# Patient Record
Sex: Male | Born: 1970 | Race: White | Hispanic: No | Marital: Married | Smoking: Never smoker
Health system: Southern US, Community
[De-identification: ages and names within clinical notes are randomized; demographics above are authoritative.]

## PROBLEM LIST (undated history)

## (undated) DIAGNOSIS — I10 Essential (primary) hypertension: Secondary | ICD-10-CM

## (undated) DIAGNOSIS — E7800 Pure hypercholesterolemia, unspecified: Secondary | ICD-10-CM

## (undated) DIAGNOSIS — I4891 Unspecified atrial fibrillation: Secondary | ICD-10-CM

## (undated) HISTORY — DX: Essential (primary) hypertension: I10

## (undated) HISTORY — DX: Unspecified atrial fibrillation: I48.91

---

## 2016-11-15 ENCOUNTER — Observation Stay (HOSPITAL_COMMUNITY)
Admission: EM | Admit: 2016-11-15 | Discharge: 2016-11-16 | Disposition: A | Discharge status: Home / Self Care | Payer: No Typology Code available for payment source | Attending: Family Medicine | Admitting: Family Medicine

## 2016-11-15 ENCOUNTER — Emergency Department (HOSPITAL_COMMUNITY): Payer: No Typology Code available for payment source

## 2016-11-15 ENCOUNTER — Encounter (HOSPITAL_COMMUNITY): Payer: Self-pay | Admitting: *Deleted

## 2016-11-15 DIAGNOSIS — Z79899 Other long term (current) drug therapy: Secondary | ICD-10-CM | POA: Insufficient documentation

## 2016-11-15 DIAGNOSIS — I4892 Unspecified atrial flutter: Secondary | ICD-10-CM | POA: Insufficient documentation

## 2016-11-15 DIAGNOSIS — I480 Paroxysmal atrial fibrillation: Secondary | ICD-10-CM | POA: Diagnosis present

## 2016-11-15 DIAGNOSIS — E7800 Pure hypercholesterolemia, unspecified: Secondary | ICD-10-CM | POA: Insufficient documentation

## 2016-11-15 DIAGNOSIS — Z8249 Family history of ischemic heart disease and other diseases of the circulatory system: Secondary | ICD-10-CM | POA: Diagnosis not present

## 2016-11-15 DIAGNOSIS — I4510 Unspecified right bundle-branch block: Secondary | ICD-10-CM | POA: Diagnosis not present

## 2016-11-15 DIAGNOSIS — Z7982 Long term (current) use of aspirin: Secondary | ICD-10-CM | POA: Insufficient documentation

## 2016-11-15 DIAGNOSIS — I700 Atherosclerosis of aorta: Secondary | ICD-10-CM | POA: Insufficient documentation

## 2016-11-15 DIAGNOSIS — E785 Hyperlipidemia, unspecified: Secondary | ICD-10-CM | POA: Insufficient documentation

## 2016-11-15 DIAGNOSIS — I10 Essential (primary) hypertension: Secondary | ICD-10-CM | POA: Diagnosis present

## 2016-11-15 DIAGNOSIS — I4891 Unspecified atrial fibrillation: Secondary | ICD-10-CM | POA: Diagnosis present

## 2016-11-15 DIAGNOSIS — R002 Palpitations: Secondary | ICD-10-CM | POA: Diagnosis not present

## 2016-11-15 HISTORY — DX: Essential (primary) hypertension: I10

## 2016-11-15 HISTORY — DX: Unspecified atrial fibrillation: I48.91

## 2016-11-15 HISTORY — DX: Pure hypercholesterolemia, unspecified: E78.00

## 2016-11-15 LAB — TSH: TSH: 2.961 u[IU]/mL (ref 0.350–4.500)

## 2016-11-15 LAB — CBC
HCT: 40.5 % (ref 39.0–52.0)
HEMOGLOBIN: 13.9 g/dL (ref 13.0–17.0)
MCH: 31.4 pg (ref 26.0–34.0)
MCHC: 34.3 g/dL (ref 30.0–36.0)
MCV: 91.4 fL (ref 78.0–100.0)
Platelets: 188 10*3/uL (ref 150–400)
RBC: 4.43 MIL/uL (ref 4.22–5.81)
RDW: 12.4 % (ref 11.5–15.5)
WBC: 6.6 10*3/uL (ref 4.0–10.5)

## 2016-11-15 LAB — I-STAT CHEM 8, ED
BUN: 14 mg/dL (ref 6–20)
CHLORIDE: 101 mmol/L (ref 101–111)
Calcium, Ion: 1.21 mmol/L (ref 1.15–1.40)
Creatinine, Ser: 0.8 mg/dL (ref 0.61–1.24)
Glucose, Bld: 112 mg/dL — ABNORMAL HIGH (ref 65–99)
HCT: 40 % (ref 39.0–52.0)
Hemoglobin: 13.6 g/dL (ref 13.0–17.0)
POTASSIUM: 3.5 mmol/L (ref 3.5–5.1)
SODIUM: 141 mmol/L (ref 135–145)
TCO2: 29 mmol/L (ref 0–100)

## 2016-11-15 LAB — BASIC METABOLIC PANEL
ANION GAP: 8 (ref 5–15)
BUN: 12 mg/dL (ref 6–20)
CO2: 27 mmol/L (ref 22–32)
Calcium: 9.3 mg/dL (ref 8.9–10.3)
Chloride: 104 mmol/L (ref 101–111)
Creatinine, Ser: 0.85 mg/dL (ref 0.61–1.24)
GFR calc Af Amer: >60 mL/min (ref >60)
GFR calc non Af Amer: >60 mL/min (ref >60)
Glucose, Bld: 115 mg/dL — ABNORMAL HIGH (ref 65–99)
POTASSIUM: 3.5 mmol/L (ref 3.5–5.1)
SODIUM: 139 mmol/L (ref 135–145)

## 2016-11-15 LAB — I-STAT TROPONIN, ED: Troponin i, poc: 0 ng/mL (ref 0.00–0.08)

## 2016-11-15 LAB — T4, FREE: FREE T4: 1.05 ng/dL (ref 0.61–1.12)

## 2016-11-15 MED ORDER — HEPARIN (PORCINE) IN NACL 100-0.45 UNIT/ML-% IJ SOLN
1200.0000 [IU]/h | INTRAMUSCULAR | Status: DC
  Administered 2016-11-15: 1200 [IU]/h via INTRAVENOUS
  Filled 2016-11-15: qty 250

## 2016-11-15 MED ORDER — METOPROLOL TARTRATE 25 MG PO TABS: 25.0000 mg | ORAL_TABLET | Freq: Once | ORAL | Status: DC

## 2016-11-15 MED ORDER — HEPARIN BOLUS VIA INFUSION
4000.0000 [IU] | Freq: Once | INTRAVENOUS | Status: AC
  Administered 2016-11-15: 4000 [IU] via INTRAVENOUS
  Filled 2016-11-15: qty 4000

## 2016-11-15 NOTE — ED Provider Notes (Signed)
[facility] DEPT Provider Note   CSN: [id number] Arrival date & time: 11/15/16  1948     History   Chief Complaint Chief Complaint  Patient presents with  . Abnormal ECG    HPI Johnathan Walker is a 46 y.o. male.   Palpitations   This is a new problem. Episode onset: Intermittent over the past couple of weeks. The problem occurs daily. The problem has not changed since onset.Associated with: Yesterday occurred laying in bed. Today occurred while on a run, chest discomfort, mild shortness of breath, felt like his heart was beating "weird". Patient was seen at his primary care physician, told he was in A. fib and to come to the ED. Associated symptoms include chest pain, chest pressure, irregular heartbeat and shortness of breath. Pertinent negatives include no diaphoresis, no fever, no PND, no syncope, no abdominal pain, no nausea, no vomiting, no headaches, no back pain, no lower extremity edema, no dizziness, no weakness and no cough. He has tried nothing for the symptoms. Risk factors include hyperhomocysteinemia and being male.    Past Medical History:  Diagnosis Date  . Hypercholesteremia   . Hypertension     Patient Active Problem List   Diagnosis Date Noted  . New onset atrial fibrillation ([place]) 11/15/2016    History reviewed. No pertinent surgical history.     Home Medications    Prior to Admission medications   Medication Sig Start Date End Date Taking? Authorizing Provider  aspirin-acetaminophen-caffeine (EXCEDRIN MIGRAINE) [phone] MG tablet Take 2 tablets by mouth every 6 (six) hours as needed for headache.   Yes [provider]  ibuprofen (ADVIL,MOTRIN) 200 MG tablet Take 600 mg by mouth every 6 (six) hours as needed for headache (back pain).   Yes [provider]  losartan (COZAAR) 50 MG tablet Take 75 mg by mouth daily. 10/22/16  Yes [provider]  Multiple Vitamin (MULTIVITAMIN) tablet Take 1 tablet by mouth daily.   Yes  [provider]  niacin (SLO-NIACIN) 500 MG tablet Take 1,500 mg by mouth at bedtime.   Yes [provider]  Omega 3 1200 MG CAPS Take 2,400 mg by mouth daily.   Yes [provider]    Family History No family history on file.  Social History Social History  Substance Use Topics  . Smoking status: Never Smoker  . Smokeless tobacco: Never Used  . Alcohol use No     Allergies   Patient has no known allergies.   Review of Systems Review of Systems  Constitutional: Negative for appetite change, diaphoresis and fever.  HENT: Negative for congestion.   Eyes: Negative for visual disturbance.  Respiratory: Positive for chest tightness and shortness of breath. Negative for cough.   Cardiovascular: Positive for chest pain and palpitations. Negative for syncope and PND.  Gastrointestinal: Negative for abdominal pain, blood in stool, nausea and vomiting.  Genitourinary: Negative for decreased urine volume and dysuria.  Musculoskeletal: Negative for back pain.  Skin: Negative for rash.  Neurological: Negative for dizziness, seizures, weakness, light-headedness and headaches.  Psychiatric/Behavioral: Negative for behavioral problems.     Physical Exam Updated Vital Signs BP 112/70   Pulse 80   Temp 98.1 F (36.7 C)   Resp 15   Ht 6' 2"  (1.88 m)   Wt 74.8 kg (165 lb)   SpO2 96%   BMI 21.18 kg/m   Physical Exam  Constitutional: He is oriented to person, place, and time. He appears well-developed and well-nourished.  HENT:  Head: Atraumatic.  Mouth/Throat: Oropharynx is clear and moist.  Eyes: Conjunctivae and EOM are normal.  Neck: Normal range of motion. No JVD present.  Cardiovascular: Normal heart sounds and intact distal pulses.   No murmur heard. Irregularly irregular, tachycardic  Pulmonary/Chest: Effort normal and breath sounds normal. No respiratory distress.  Abdominal: Soft. He exhibits no distension. There is no tenderness. There is  no guarding.  Musculoskeletal: Normal range of motion. He exhibits no edema.  No unilateral leg swelling, no tenderness to palpation.  Neurological: He is alert and oriented to person, place, and time.  Skin: Skin is warm.  Psychiatric: He has a normal mood and affect.     ED Treatments / Results  Labs (all labs ordered are listed, but only abnormal results are displayed) Labs Reviewed  BASIC METABOLIC PANEL - Abnormal; Notable for the following:       Result Value   Glucose, Bld 115 (*)    All other components within normal limits  I-STAT CHEM 8, ED - Abnormal; Notable for the following:    Glucose, Bld 112 (*)    All other components within normal limits  CBC  TSH  T4, FREE  HEPARIN LEVEL (UNFRACTIONATED)  CBC  I-STAT TROPOININ, ED    EKG  EKG Interpretation  Date/Time:  Monday November 15 2016 19:57:51 EDT Ventricular Rate:  135 PR Interval:    QRS Duration: 86 QT Interval:  320 QTC Calculation: 480 R Axis:   -102 Text Interpretation:  Atrial fibrillation with rapid ventricular response Right superior axis deviation Pulmonary disease pattern Right ventricular hypertrophy with repolarization abnormality Marked ST abnormality, possible inferior subendocardial injury Abnormal ECG Confirmed by Hazle Coca [phone]) on 11/15/2016 8:05:27 PM       Radiology Dg Chest Portable 1 View  Result Date: 11/15/2016 CLINICAL DATA:  New onset atrial fibrillation EXAM: PORTABLE CHEST 1 VIEW COMPARISON:  None. FINDINGS: The heart size and mediastinal contours are within normal limits. Minimal atherosclerosis of the aortic arch. Both lungs are clear. The visualized skeletal structures are unremarkable. IMPRESSION: No active disease.  Minimal aortic atherosclerosis. Electronically Signed   By: Ashley Royalty M.D.   On: 11/15/2016 20:25    Procedures Procedures (including critical care time)  Medications Ordered in ED Medications  metoprolol tartrate (LOPRESSOR) tablet 25 mg (not administered)   heparin ADULT infusion 100 units/mL (25000 units/228m sodium chloride 0.45%) (1,200 Units/hr Intravenous New Bag/Given 11/15/16 2153)  heparin bolus via infusion 4,000 Units (4,000 Units Intravenous Bolus from Bag 11/15/16 2153)     Initial Impression / Assessment and Plan / ED Course  I have reviewed the triage vital signs and the nursing notes.  Pertinent labs & imaging results that were available during my care of the patient were reviewed by me and considered in my medical decision making (see chart for details).     Patient is a 46year old male past history significant for hypertension and hyperlipidemia, who presents to the ED from his primary care physician for A. fib with RVR. New-onset A. fib. Intermittent palpitations for the past one month. No acute distress, not ill appearing. Afebrile. Tachycardic to 140s on arrival, improvement to 90-110s, hemodynamically stable.  EKG showed A. fib with RVR, rate 135, nonspecific ST changes, No signs of acute ischemia. Chest x-ray unremarkable. Initial troponin undetectable. Given that the patient has had intermittent heart palpitations for the past month, concern for paroxysmal A. fib, not a good candidate for ED cardioversion. Started on heparin infusion. Spontaneously improved rate  to 80-100 in the ED. Given by mouth Lopressor. Cardiology consulted, recommended admission to medicine.   Patient stable for the floor.  Final Clinical Impressions(s) / ED Diagnoses   Final diagnoses:  Paroxysmal atrial fibrillation [facility])    New Prescriptions New Prescriptions   No medications on file     Nathaniel Man, MD 11/15/16 2231    Quintella Reichert, MD 11/20/16 1113

## 2016-11-15 NOTE — ED Triage Notes (Signed)
Pt to ED form [facility] for new onset afib. Pt started having palpitations and sob while running this afternoon. Rate from 40-120. Pt denies pain. Hx of high cholesterol and htn that are controlled with medication

## 2016-11-15 NOTE — Progress Notes (Signed)
ANTICOAGULATION CONSULT NOTE - Initial Consult  Pharmacy Consult for Heparin Indication: atrial fibrillation  No Known Allergies  Patient Measurements: Height: 6' 2"  (188 cm) Weight: 165 lb (74.8 kg) IBW/kg (Calculated) : 82.2 Heparin Dosing Weight: 74.8 kg  Vital Signs: Temp: 98.1 F (36.7 C) (06/11 2000) BP: 140/83 (06/11 2030) Pulse Rate: 140 (06/11 2000)  Labs:  Recent Labs  11/15/16 1957 11/15/16 2015  HGB 13.9 13.6  HCT 40.5 40.0  PLT 188  --   CREATININE  --  0.80    CrCl cannot be calculated (Unknown ideal weight.).   Medical History: Past Medical History:  Diagnosis Date  . Hypercholesteremia   . Hypertension     Medications:   (Not in a hospital admission) Scheduled:  Infusions:   Assessment: 46yo male presents from OP clinic with new onset Afib. Pharmacy is consulted to dose heparin for atrial fibrillation.   Goal of Therapy:  Heparin level 0.3-0.7 units/ml Monitor platelets by anticoagulation protocol: Yes   Plan:  Give 4000 units bolus x 1 Start heparin infusion at 1200 units/hr Check anti-Xa level in 6 hours and daily while on heparin Continue to monitor H&H and platelets  Johnathan Walker. Diona Foley, PharmD, BCPS Clinical Pharmacist [phone] 11/15/2016,9:03 PM

## 2016-11-15 NOTE — ED Notes (Signed)
EDP at bedside  

## 2016-11-16 ENCOUNTER — Encounter (HOSPITAL_COMMUNITY): Payer: Self-pay | Admitting: Internal Medicine

## 2016-11-16 ENCOUNTER — Observation Stay (HOSPITAL_BASED_OUTPATIENT_CLINIC_OR_DEPARTMENT_OTHER): Payer: No Typology Code available for payment source

## 2016-11-16 DIAGNOSIS — I10 Essential (primary) hypertension: Secondary | ICD-10-CM | POA: Diagnosis not present

## 2016-11-16 DIAGNOSIS — I4891 Unspecified atrial fibrillation: Secondary | ICD-10-CM | POA: Diagnosis present

## 2016-11-16 HISTORY — DX: Unspecified atrial fibrillation: I48.91

## 2016-11-16 HISTORY — DX: Essential (primary) hypertension: I10

## 2016-11-16 LAB — BASIC METABOLIC PANEL
ANION GAP: 8 (ref 5–15)
BUN: 13 mg/dL (ref 6–20)
CO2: 25 mmol/L (ref 22–32)
CREATININE: 0.76 mg/dL (ref 0.61–1.24)
Calcium: 9.2 mg/dL (ref 8.9–10.3)
Chloride: 107 mmol/L (ref 101–111)
Glucose, Bld: 97 mg/dL (ref 65–99)
Potassium: 4.5 mmol/L (ref 3.5–5.1)
SODIUM: 140 mmol/L (ref 135–145)

## 2016-11-16 LAB — HEPARIN LEVEL (UNFRACTIONATED)
HEPARIN UNFRACTIONATED: 0.29 [IU]/mL — AB (ref 0.30–0.70)
HEPARIN UNFRACTIONATED: 0.43 [IU]/mL (ref 0.30–0.70)

## 2016-11-16 LAB — CBC
HCT: 39.4 % (ref 39.0–52.0)
HEMOGLOBIN: 13.2 g/dL (ref 13.0–17.0)
MCH: 31.1 pg (ref 26.0–34.0)
MCHC: 33.5 g/dL (ref 30.0–36.0)
MCV: 92.7 fL (ref 78.0–100.0)
PLATELETS: 187 10*3/uL (ref 150–400)
RBC: 4.25 MIL/uL (ref 4.22–5.81)
RDW: 12.6 % (ref 11.5–15.5)
WBC: 3.6 10*3/uL — AB (ref 4.0–10.5)

## 2016-11-16 LAB — ECHOCARDIOGRAM COMPLETE
HEIGHTINCHES: 74 in
Weight: 2686.4 oz

## 2016-11-16 LAB — TROPONIN I

## 2016-11-16 LAB — TSH: TSH: 3.039 u[IU]/mL (ref 0.350–4.500)

## 2016-11-16 LAB — HIV ANTIBODY (ROUTINE TESTING W REFLEX): HIV Screen 4th Generation wRfx: NONREACTIVE

## 2016-11-16 MED ORDER — ACETAMINOPHEN 650 MG RE SUPP: 650.0000 mg | Freq: Four times a day (QID) | RECTAL | Status: DC | PRN

## 2016-11-16 MED ORDER — ONDANSETRON HCL 4 MG PO TABS: 4.0000 mg | ORAL_TABLET | Freq: Four times a day (QID) | ORAL | Status: DC | PRN

## 2016-11-16 MED ORDER — HEPARIN (PORCINE) IN NACL 100-0.45 UNIT/ML-% IJ SOLN
1350.0000 [IU]/h | INTRAMUSCULAR | Status: DC
  Administered 2016-11-16: 1350 [IU]/h via INTRAVENOUS

## 2016-11-16 MED ORDER — ASPIRIN EC 81 MG PO TBEC
81.0000 mg | DELAYED_RELEASE_TABLET | Freq: Every day | ORAL | 2 refills | Status: DC
Start: 2016-11-16 — End: 2017-02-09

## 2016-11-16 MED ORDER — NIACIN ER 500 MG PO TBCR: 1500.0000 mg | EXTENDED_RELEASE_TABLET | Freq: Every day | ORAL | Status: DC

## 2016-11-16 MED ORDER — METOPROLOL TARTRATE 25 MG PO TABS: 12.5000 mg | ORAL_TABLET | Freq: Two times a day (BID) | ORAL | 0 refills | Status: DC

## 2016-11-16 MED ORDER — ONDANSETRON HCL 4 MG/2ML IJ SOLN: 4.0000 mg | Freq: Four times a day (QID) | INTRAMUSCULAR | Status: DC | PRN

## 2016-11-16 MED ORDER — LOSARTAN POTASSIUM 50 MG PO TABS
75.0000 mg | ORAL_TABLET | Freq: Every day | ORAL | Status: DC
  Administered 2016-11-16: 75 mg via ORAL
  Filled 2016-11-16: qty 1

## 2016-11-16 MED ORDER — ACETAMINOPHEN 325 MG PO TABS: 650.0000 mg | ORAL_TABLET | Freq: Four times a day (QID) | ORAL | Status: DC | PRN

## 2016-11-16 MED ORDER — OMEGA-3-ACID ETHYL ESTERS 1 G PO CAPS
2000.0000 mg | ORAL_CAPSULE | Freq: Every day | ORAL | Status: DC
  Administered 2016-11-16: 2000 mg via ORAL
  Filled 2016-11-16: qty 2

## 2016-11-16 MED ORDER — METOPROLOL TARTRATE 12.5 MG HALF TABLET
12.5000 mg | ORAL_TABLET | Freq: Two times a day (BID) | ORAL | Status: DC
  Administered 2016-11-16: 12.5 mg via ORAL
  Filled 2016-11-16: qty 1

## 2016-11-16 NOTE — Progress Notes (Signed)
  Echocardiogram 2D Echocardiogram has been performed.  Britt Petroni 11/16/2016, 12:22 PM

## 2016-11-16 NOTE — Progress Notes (Signed)
Pt arrived to floor with tech. Walked from stretcher to bed. No complaints of pain. Resting comfortably in bed with call bell within reach.

## 2016-11-16 NOTE — Progress Notes (Signed)
ANTICOAGULATION CONSULT NOTE - Follow Up Consult  Pharmacy Consult for Heparin Indication: atrial fibrillation  No Known Allergies  Patient Measurements: Height: 6' 2"  (188 cm) Weight: 167 lb 14.4 oz (76.2 kg) IBW/kg (Calculated) : 82.2  Vital Signs: Temp: 97.8 F (36.6 C) (06/12 0415) Temp Source: Oral (06/12 0415) BP: 131/64 (06/12 0415) Pulse Rate: 55 (06/12 0415)  Labs:  Recent Labs  11/15/16 1957 11/15/16 2015 11/16/16 0059 11/16/16 0729  HGB 13.9 13.6  --  13.2  HCT 40.5 40.0  --  39.4  PLT 188  --   --  187  HEPARINUNFRC  --   --  0.43 0.29*  CREATININE 0.85 0.80  --   --   TROPONINI  --   --  <0.03  --     Estimated Creatinine Clearance: 125.7 mL/min (by C-G formula based on SCr of 0.8 mg/dL).   Medications:  Heparin @ 1200 units/hr  Assessment: 46yom started on heparin yesterday for new onset afib. Initial heparin level was 0.43 but drawn only 3 hours after start of infusion. Follow up level 0.29 is just slightly below goal. CBC wnl. No bleeding.  Goal of Therapy:  Heparin level 0.3-0.7 units/ml Monitor platelets by anticoagulation protocol: Yes   Plan:  1) Increase heparin to 1350 units/hr 2) Daily heparin level and CBC 3) Follow up transition to oral AC  Deboraha Sprang 11/16/2016,8:17 AM

## 2016-11-16 NOTE — H&P (Signed)
History and Physical    Willet Schleifer LKJ:[id number] DOB: 12-03-1970 DOA: 11/15/2016  PCP: Hulan Fess, MD  Patient coming from: Home.  Chief Complaint: Palpitations.  HPI: Johnathan Walker is a 46 y.o. male with history of hypertension presents with the ER with complaints of palpitations. Patient states he has been getting palpitations off and on for last 1 month. Last evening around 5 PM and patient was going for his regular jogging patient started developing palpitations but this persisted so he went to his PCP. Along with the palpitations patient also had chest tightness and shortness of breath. Patient had gone to his PCPs office and was found to be in A. fib with RVR and was referred to the ER.   ED Course: In the ER patient's rate improved spontaneously without any intervention. He had physician and discuss with cardiologist. Patient was started on heparin and admitted for further observation. At the time of my exam patient has changed back to sinus rhythm. But has been having periods of atrial flutter and A. fib.  Review of Systems: As per HPI, rest all negative.   Past Medical History:  Diagnosis Date  . Hypercholesteremia   . Hypertension     History reviewed. No pertinent surgical history.   reports that he has never smoked. He has never used smokeless tobacco. He reports that he does not drink alcohol or use drugs.  No Known Allergies  Family History  Problem Relation Age of Onset  . Atrial fibrillation Mother     Prior to Admission medications   Medication Sig Start Date End Date Taking? Authorizing Provider  aspirin-acetaminophen-caffeine (EXCEDRIN MIGRAINE) [phone] MG tablet Take 2 tablets by mouth every 6 (six) hours as needed for headache.   Yes [provider]  ibuprofen (ADVIL,MOTRIN) 200 MG tablet Take 600 mg by mouth every 6 (six) hours as needed for headache (back pain).   Yes [provider]  losartan (COZAAR) 50 MG tablet Take 75 mg  by mouth daily. 10/22/16  Yes [provider]  Multiple Vitamin (MULTIVITAMIN) tablet Take 1 tablet by mouth daily.   Yes [provider]  niacin (SLO-NIACIN) 500 MG tablet Take 1,500 mg by mouth at bedtime.   Yes [provider]  Omega 3 1200 MG CAPS Take 2,400 mg by mouth daily.   Yes [provider]    Physical Exam: Vitals:   11/15/16 2200 11/15/16 2230 11/15/16 2257 11/15/16 2348  BP: 112/70 112/68 112/68 131/82  Pulse: 80   62  Resp: 15 14 16 16   Temp:   97.6 F (36.4 C) 97.9 F (36.6 C)  TempSrc:   Oral Oral  SpO2: 96% 96% 97% 100%  Weight:    76.2 kg (167 lb 14.4 oz)  Height:    6' 2"  (1.88 m)      Constitutional: Moderately built and nourished. Vitals:   11/15/16 2200 11/15/16 2230 11/15/16 2257 11/15/16 2348  BP: 112/70 112/68 112/68 131/82  Pulse: 80   62  Resp: 15 14 16 16   Temp:   97.6 F (36.4 C) 97.9 F (36.6 C)  TempSrc:   Oral Oral  SpO2: 96% 96% 97% 100%  Weight:    76.2 kg (167 lb 14.4 oz)  Height:    6' 2"  (1.88 m)   Eyes: Anicteric no pallor. ENMT: No discharge from the ears eyes nose and mouth. Neck: No mass felt. No neck rigidity. Respiratory: No rhonchi or crepitations. Cardiovascular: S1-S2 heard no murmurs appreciated. Abdomen: Soft  nontender bowel sounds present. Musculoskeletal: No edema. No joint effusion. Skin: No rash. Skin appears warm. Neurologic: Alert awake oriented to time place and person. Moves all extremities. Psychiatric: Appears normal. Normal affect.   Labs on Admission: I have personally reviewed following labs and imaging studies  CBC:  Recent Labs Lab 11/15/16 1957 11/15/16 2015  WBC 6.6  --   HGB 13.9 13.6  HCT 40.5 40.0  MCV 91.4  --   PLT 188  --    Basic Metabolic Panel:  Recent Labs Lab 11/15/16 1957 11/15/16 2015  NA 139 141  K 3.5 3.5  CL 104 101  CO2 27  --   GLUCOSE 115* 112*  BUN 12 14  CREATININE 0.85 0.80  CALCIUM 9.3  --    GFR: Estimated  Creatinine Clearance: 125.7 mL/min (by C-G formula based on SCr of 0.8 mg/dL). Liver Function Tests: No results for input(s): AST, ALT, ALKPHOS, BILITOT, PROT, ALBUMIN in the last 168 hours. No results for input(s): LIPASE, AMYLASE in the last 168 hours. No results for input(s): AMMONIA in the last 168 hours. Coagulation Profile: No results for input(s): INR, PROTIME in the last 168 hours. Cardiac Enzymes: No results for input(s): CKTOTAL, CKMB, CKMBINDEX, TROPONINI in the last 168 hours. BNP (last 3 results) No results for input(s): PROBNP in the last 8760 hours. HbA1C: No results for input(s): HGBA1C in the last 72 hours. CBG: No results for input(s): GLUCAP in the last 168 hours. Lipid Profile: No results for input(s): CHOL, HDL, LDLCALC, TRIG, CHOLHDL, LDLDIRECT in the last 72 hours. Thyroid Function Tests:  Recent Labs  11/15/16 2106  TSH 2.961  FREET4 1.05   Anemia Panel: No results for input(s): VITAMINB12, FOLATE, FERRITIN, TIBC, IRON, RETICCTPCT in the last 72 hours. Urine analysis: No results found for: COLORURINE, APPEARANCEUR, LABSPEC, PHURINE, GLUCOSEU, HGBUR, BILIRUBINUR, KETONESUR, PROTEINUR, UROBILINOGEN, NITRITE, LEUKOCYTESUR Sepsis Labs: @LABRCNTIP (procalcitonin:4,lacticidven:4) )No results found for this or any previous visit (from the past 240 hour(s)).   Radiological Exams on Admission: Dg Chest Portable 1 View  Result Date: 11/15/2016 CLINICAL DATA:  New onset atrial fibrillation EXAM: PORTABLE CHEST 1 VIEW COMPARISON:  None. FINDINGS: The heart size and mediastinal contours are within normal limits. Minimal atherosclerosis of the aortic arch. Both lungs are clear. The visualized skeletal structures are unremarkable. IMPRESSION: No active disease.  Minimal aortic atherosclerosis. Electronically Signed   By: Ashley Royalty M.D.   On: 11/15/2016 20:25    EKG: Independently reviewed. A. fib with RVR.  Assessment/Plan Principal Problem:   New onset atrial  fibrillation (HCC) Active Problems:   Essential hypertension   Atrial fibrillation with rapid ventricular response ([place])    1. A. fib with RVR new onset - chest 2 vasc score is 1 for hypertension. At this time patient will be placed on heparin and get 2-D echo and cycle cardiac markers check thyroid function tests and I have requested cardiology consult. I have added a small dose of Lopressor 12.5 mg by mouth twice a day. 2. Hypertension on Cozaar.   DVT prophylaxis: Heparin. Code Status: Full code.  Family Communication: Family at the bedside.  Disposition Plan: Home.  Consults called: Cardiology consult requested.  Admission status: Observation.    Rise Patience MD [facility] Pager [phone].  If 7PM-7AM, please contact night-coverage [email or web address] Password [facility]  11/16/2016, 12:24 AM

## 2016-11-16 NOTE — Progress Notes (Signed)
Discussed with the patient and all questioned fully answered. He will call me if any problems arise.  Iv removed. Telemetry removed, CCMD notified. Pt and wife verbalized understanding of all discharge instructions. Given discharge envelope.  Fritz Pickerel, RN

## 2016-11-16 NOTE — Discharge Summary (Signed)
Physician Discharge Summary  Johnathan Walker [id number] DOB: [birth year] DOA: 11/15/2016  PCP: Johnathan Fess, MD  Admit date: 11/15/2016 Discharge date: 11/16/2016  Time spent: > 35 minutes  Recommendations for Outpatient Follow-up:  1. Patient should follow-up with cardiologist for further evaluation recommendations   Discharge Diagnoses:  Principal Problem:   New onset atrial fibrillation [facility]) Active Problems:   Essential hypertension   Atrial fibrillation with rapid ventricular response [facility])   Discharge Condition: Stable  Diet recommendation: Heart healthy  Filed Weights   11/15/16 2100 11/15/16 2348  Weight: 74.8 kg (165 lb) 76.2 kg (167 lb 14.4 oz)    History of present illness:  46 year old with history of snoring who presented to his primary care physician complaining of palpitations. Was found to be new onset atrial fibrillation.  Hospital Course:  New-onset atrial fibrillation - Cardiologist consulted and recommended metoprolol and baby aspirin. - Patient should also have sleep study to see if he has OSA which is strongly suspected at this point.  For other known medical problems listed above will continue medication regimen listed below  Procedures:  Echocardiogram: EF 55-60%  Consultations:  Cardiology  Discharge Exam: Vitals:   11/16/16 0415 11/16/16 1224  BP: 131/64 127/74  Pulse: (!) 55 62  Resp: 14 17  Temp: 97.8 F (36.6 C) 99 F (37.2 C)    General: Patient in no acute distress, alert and awake Cardiovascular: Regular rate and rhythm, no murmurs or rubs Respiratory: No increased work of breathing, no wheezes  Discharge Instructions   Discharge Instructions    Call MD for:  difficulty breathing, headache or visual disturbances    Complete by:  As directed    Call MD for:  extreme fatigue    Complete by:  As directed    Call MD for:  temperature >100.4    Complete by:  As directed    Diet - low sodium heart healthy    Complete  by:  As directed    Discharge instructions    Complete by:  As directed    Please be sure to follow up with your cardiologist for further evaluation and recommendations.  Also follow up or call your primary care physician so that they can set up a sleep study and you can get evaluated for Obstructive sleep apnea   Increase activity slowly    Complete by:  As directed      Current Discharge Medication List    START taking these medications   Details  aspirin EC 81 MG tablet Take 1 tablet (81 mg total) by mouth daily. Qty: 150 tablet, Refills: 2    metoprolol tartrate (LOPRESSOR) 25 MG tablet Take 0.5 tablets (12.5 mg total) by mouth 2 (two) times daily. Qty: 30 tablet, Refills: 0      CONTINUE these medications which have NOT CHANGED   Details  losartan (COZAAR) 50 MG tablet Take 75 mg by mouth daily. Refills: 2    Multiple Vitamin (MULTIVITAMIN) tablet Take 1 tablet by mouth daily.    niacin (SLO-NIACIN) 500 MG tablet Take 1,500 mg by mouth at bedtime.    Omega 3 1200 MG CAPS Take 2,400 mg by mouth daily.      STOP taking these medications     aspirin-acetaminophen-caffeine (EXCEDRIN MIGRAINE) 250-250-65 MG tablet      ibuprofen (ADVIL,MOTRIN) 200 MG tablet        No Known Allergies    The results of significant diagnostics from this hospitalization (including imaging, microbiology, ancillary and  laboratory) are listed below for reference.    Significant Diagnostic Studies: Dg Chest Portable 1 View  Result Date: 11/15/2016 CLINICAL DATA:  New onset atrial fibrillation EXAM: PORTABLE CHEST 1 VIEW COMPARISON:  None. FINDINGS: The heart size and mediastinal contours are within normal limits. Minimal atherosclerosis of the aortic arch. Both lungs are clear. The visualized skeletal structures are unremarkable. IMPRESSION: No active disease.  Minimal aortic atherosclerosis. Electronically Signed   By: Ashley Royalty M.D.   On: 11/15/2016 20:25    Microbiology: No results  found for this or any previous visit (from the past 240 hour(s)).   Labs: Basic Metabolic Panel:  Recent Labs Lab 11/15/16 1957 11/15/16 2015 11/16/16 0729  NA 139 141 140  K 3.5 3.5 4.5  CL 104 101 107  CO2 27  --  25  GLUCOSE 115* 112* 97  BUN 12 14 13   CREATININE 0.85 0.80 0.76  CALCIUM 9.3  --  9.2   Liver Function Tests: No results for input(s): AST, ALT, ALKPHOS, BILITOT, PROT, ALBUMIN in the last 168 hours. No results for input(s): LIPASE, AMYLASE in the last 168 hours. No results for input(s): AMMONIA in the last 168 hours. CBC:  Recent Labs Lab 11/15/16 1957 11/15/16 2015 11/16/16 0729  WBC 6.6  --  3.6*  HGB 13.9 13.6 13.2  HCT 40.5 40.0 39.4  MCV 91.4  --  92.7  PLT 188  --  187   Cardiac Enzymes:  Recent Labs Lab 11/16/16 0059 11/16/16 0729  TROPONINI <0.03 <0.03   BNP: BNP (last 3 results) No results for input(s): BNP in the last 8760 hours.  ProBNP (last 3 results) No results for input(s): PROBNP in the last 8760 hours.  CBG: No results for input(s): GLUCAP in the last 168 hours.     Signed:  Velvet Bathe MD.  [facility] 11/16/2016, 2:52 PM

## 2016-11-16 NOTE — Consult Note (Signed)
Cardiology Consultation:   Patient ID: Johnathan Walker; [id number]; 29-May-1971   Admit date: 11/15/2016 Date of Consult: 11/16/2016  Primary Care Provider: Hulan Fess, MD Primary Cardiologist: New (Dr. Acie Fredrickson)  Primary Electrophysiologist:  None   Patient Profile:   Johnathan Walker is a 46 y.o. male with a hx of hypertension and hypercholesterolemia who is being seen today for the evaluation of new onset atrial fibrillation with RVR at the request of Dr. Wendee Beavers.  History of Present Illness:   [name] Presented to the ER for evaluations of palpitations that she had been feeling for over the past month. He does not have palpitations on a daily basis. Yesterday around 5 pm she went jogging and developed persistent palpitations associated with SOB and tightness in his chest, went to his PCP and was noted to be in atrial fibrillation with RVR ( hr 130-140s ) and sent to the ER.  ER Course: In the ER he spontaneously had improvement of his rate and ultimately his rhythm. He was started on heparin and admitted for observation.   Hospital course:  Lopressor 12.5 mg BID added by [facility], rate controlled this AM and in sinus. Normal TSH, neg cardiac enzymes x 2, potassium 4.5 Echocardiogram pending. He has not had any reoccurrence of palpitations since midnight last night. This morning he feels well, without CP, SOB, or le swelling.  Past Medical History:  Diagnosis Date  . Hypercholesteremia   . Hypertension     History reviewed. No pertinent surgical history.   Inpatient Medications: Scheduled Meds: . losartan  75 mg Oral Daily  . metoprolol tartrate  12.5 mg Oral BID  . niacin  1,500 mg Oral QHS  . omega-3 acid ethyl esters  2,000 mg Oral Daily   Continuous Infusions: . heparin 1,350 Units/hr (11/16/16 0900)   PRN Meds: acetaminophen **OR** acetaminophen, ondansetron **OR** ondansetron (ZOFRAN) IV  Allergies:   No Known Allergies  Social History:   Social History    Social History  . Marital status: Married    Spouse name: N/A  . Number of children: N/A  . Years of education: N/A   Occupational History  . Not on file.   Social History Main Topics  . Smoking status: Never Smoker  . Smokeless tobacco: Never Used  . Alcohol use No  . Drug use: No  . Sexual activity: No   Other Topics Concern  . Not on file   Social History Narrative  . No narrative on file    Family History:   The patient's family history includes Atrial fibrillation in his mother.  ROS:  Please see the history of present illness.  All other ROS reviewed and negative.     Physical Exam/Data:   Vitals:   11/15/16 2230 11/15/16 2257 11/15/16 2348 11/16/16 0415  BP: 112/68 112/68 131/82 131/64  Pulse:   62 (!) 55  Resp: 14 16 16 14   Temp:  97.6 F (36.4 C) 97.9 F (36.6 C) 97.8 F (36.6 C)  TempSrc:  Oral Oral Oral  SpO2: 96% 97% 100% 99%  Weight:   167 lb 14.4 oz (76.2 kg)   Height:   6' 2"  (1.88 m)    No intake or output data in the 24 hours ending 11/16/16 1102 Filed Weights   11/15/16 2100 11/15/16 2348  Weight: 165 lb (74.8 kg) 167 lb 14.4 oz (76.2 kg)   Body mass index is 21.56 kg/m.  General: Well developed, well nourished, in no acute distress. Head: Normocephalic,  atraumatic, sclera non-icteric, no xanthomas, nares are without discharge.  Neck: Negative for carotid bruits. JVD not elevated. Lungs: Clear bilaterally to auscultation without wheezes, rales, or rhonchi. Breathing is unlabored. Heart: RRR with S1 S2. No murmurs, rubs, or gallops appreciated. Abdomen: Soft, non-tender, non-distended with normoactive bowel sounds. No hepatomegaly. No rebound/guarding. No obvious abdominal masses. Msk:  Strength and tone appear normal for age. Extremities: No clubbing or cyanosis. No edema.  Distal pedal pulses are 2+ and equal bilaterally. Neuro: Alert and oriented X 3. No facial asymmetry. No focal deficit. Moves all extremities spontaneously. Psych:   Responds to questions appropriately with a normal affect.  EKG:  The EKG was personally reviewed and demonstrates HR 60s, SR, RBBB. No old EKG for comparison.  Relevant CV Studies:  Echo pending this admission  Laboratory Data:  Chemistry Recent Labs Lab 11/15/16 1957 11/15/16 2015 11/16/16 0729  NA 139 141 140  K 3.5 3.5 4.5  CL 104 101 107  CO2 27  --  25  GLUCOSE 115* 112* 97  BUN 12 14 13   CREATININE 0.85 0.80 0.76  CALCIUM 9.3  --  9.2  GFRNONAA >60  --  >60  GFRAA >60  --  >60  ANIONGAP 8  --  8    No results for input(s): PROT, ALBUMIN, AST, ALT, ALKPHOS, BILITOT in the last 168 hours. Hematology Recent Labs Lab 11/15/16 1957 11/15/16 2015 11/16/16 0729  WBC 6.6  --  3.6*  RBC 4.43  --  4.25  HGB 13.9 13.6 13.2  HCT 40.5 40.0 39.4  MCV 91.4  --  92.7  MCH 31.4  --  31.1  MCHC 34.3  --  33.5  RDW 12.4  --  12.6  PLT 188  --  187   Cardiac Enzymes Recent Labs Lab 11/16/16 0059 11/16/16 0729  TROPONINI <0.03 <0.03    Recent Labs Lab 11/15/16 2009  TROPIPOC 0.00    BNPNo results for input(s): BNP, PROBNP in the last 168 hours.  DDimer No results for input(s): DDIMER in the last 168 hours.  Radiology/Studies:  Dg Chest Portable 1 View  Result Date: 11/15/2016 CLINICAL DATA:  New onset atrial fibrillation EXAM: PORTABLE CHEST 1 VIEW COMPARISON:  None. FINDINGS: The heart size and mediastinal contours are within normal limits. Minimal atherosclerosis of the aortic arch. Both lungs are clear. The visualized skeletal structures are unremarkable. IMPRESSION: No active disease.  Minimal aortic atherosclerosis. Electronically Signed   By: Ashley Royalty M.D.   On: 11/15/2016 20:25    Assessment and Plan:   1. Atrial Fibrillation with RVR: Patient has been having palpitation sx the past month, likely has been going in and out of afib. Denies having hx of this diagnosis. CHADVASC score is 1 for hypertension, Currently on heparin we will need to transition  to oral anticoagulants. -- Lopressor 12.5 mg BID given, HR between 50-60 this am, not sure that this can be titrated any further at this time. -- no reoccurance of fib overnight.  -- echocardiogram is pending  2. Hypertension: well controlled. Continue home dose of Cozaar.  Kristopher Glee, PA-C  11/16/2016 11:02 AM   Attending Note:   The patient was seen and examined.  Agree with assessment and plan as noted above.  Changes made to the above note as needed.  Patient seen and independently examined with Delos Haring, PA .   We discussed all aspects of the encounter. I agree with the assessment and plan as stated  above.  1. Paroxysmal atrial fib:   Has converted to NSR On low dose metoprolol currently  Getting his echo  Wife says that he snores loudly and has apneic episodes .  He should be scheduled for sleep study  CHADS2VASC is 1.  I would not start [place] at this point.      2. HTN:  Continue meds.    He may be able to be discharged later today  I will see him in the office for follow up    I have spent a total of 40 minutes with patient reviewing hospital  notes , telemetry, EKGs, labs and examining patient as well as establishing an assessment and plan that was discussed with the patient. > 50% of time was spent in direct patient care.    Thayer Headings, Brooke Bonito., MD, [facility] 11/16/2016, 11:58 AM [address],  [place] Pager [phone]

## 2016-11-16 NOTE — Progress Notes (Signed)
Patient seen and evaluated earlier the same by my associate. His main complaint was palpitations and patient upon further evaluation was found to have A. fib with RVR.  I have consulted cardiology for further evaluation recommendations. Echocardiogram pending.  Gen.: Patient in no acute distress, alert and awake Cardiovascular: No cyanosis Pulmonary: No increased work of breathing, equal chest rise  We'll plan on reassessing patient next a.m. earlier should any new medical conditions arise.  Johnathan Walker, [employer]

## 2016-11-19 ENCOUNTER — Telehealth: Payer: Self-pay

## 2016-11-19 NOTE — Telephone Encounter (Signed)
SENT NOTES TO SCHEDULING 

## 2016-12-07 ENCOUNTER — Ambulatory Visit (INDEPENDENT_AMBULATORY_CARE_PROVIDER_SITE_OTHER): Payer: No Typology Code available for payment source | Admitting: Cardiology

## 2016-12-07 ENCOUNTER — Encounter: Payer: Self-pay | Admitting: Cardiology

## 2016-12-07 VITALS — BP 104/72 | HR 48 | Ht 74.0 in | Wt 178.0 lb

## 2016-12-07 DIAGNOSIS — I10 Essential (primary) hypertension: Secondary | ICD-10-CM

## 2016-12-07 DIAGNOSIS — I480 Paroxysmal atrial fibrillation: Secondary | ICD-10-CM

## 2016-12-07 MED ORDER — METOPROLOL TARTRATE 25 MG PO TABS: 12.5000 mg | ORAL_TABLET | Freq: Two times a day (BID) | ORAL | 2 refills | Status: DC

## 2016-12-07 NOTE — Progress Notes (Signed)
Electrophysiology Office Note   Date:  12/07/2016   ID:  Johnathan Walker, DOB September 01, 1970, MRN [id number]  PCP:  Hulan Fess, MD  Cardiologist: [place] Primary Electrophysiologist:  Georganna Maxson Meredith Leeds, MD    Chief Complaint  Patient presents with  . Advice Only    PAF     History of Present Illness: Johnathan Walker is a 46 y.o. male who is being seen today for the evaluation of atrial fibrillation at the request of Hulan Fess, MD. Presenting today for electrophysiology evaluation. He was hospitalized June 11th 2018 with new onset atrial fibrillation and rapid ventricular response. He was going for a run, and felt palpitations prior to that. He only ran a mile and had shortness of breath and fatigue. Echo at the time showed an EF of 55-60%. While in the emergency room, he spontaneously went into sinus rhythm. He was placed on metoprolol 12.5 mg. His wife reported in her mid and apneic episodes and he was referred for a sleep study. He has had intermittent palpitations for the past several years occurring for 5-30 minutes at a time. These are not associated with shortness of breath or lightheadedness. The episode on June 11 lasted longer and was associated with shortness of breath. Since being in the hospital, he has had a few episodes of palpitations. He says that it feels like a humming burden in his chest. These have lasted a few minutes. He feels that his symptoms have improved since being on the metoprolol.    Today, he denies symptoms of palpitations, chest pain, shortness of breath, orthopnea, PND, lower extremity edema, claudication, dizziness, presyncope, syncope, bleeding, or neurologic sequela. The patient is tolerating medications without difficulties.    Past Medical History:  Diagnosis Date  . Atrial fibrillation with rapid ventricular response ([place]) 11/16/2016  . Essential hypertension 11/16/2016  . Hypercholesteremia   . Hypertension   . New onset atrial fibrillation  ([place]) 11/15/2016   History reviewed. No pertinent surgical history.   Current Outpatient Prescriptions  Medication Sig Dispense Refill  . aspirin EC 81 MG tablet Take 1 tablet (81 mg total) by mouth daily. 150 tablet 2  . losartan (COZAAR) 50 MG tablet Take 75 mg by mouth daily.  2  . metoprolol tartrate (LOPRESSOR) 25 MG tablet Take 0.5 tablets (12.5 mg total) by mouth 2 (two) times daily. 30 tablet 0  . Multiple Vitamin (MULTIVITAMIN) tablet Take 1 tablet by mouth daily.    . niacin (SLO-NIACIN) 500 MG tablet Take 1,500 mg by mouth at bedtime.    . Omega 3 1200 MG CAPS Take 2,400 mg by mouth daily.     No current facility-administered medications for this visit.     Allergies:   Patient has no known allergies.   Social History:  The patient  reports that he has never smoked. He has never used smokeless tobacco. He reports that he does not drink alcohol or use drugs.   Family History:  The patient's family history includes Atrial fibrillation in his mother; Diabetes in his father; Hypertension in his father; Lung cancer in his father.    ROS:  Please see the history of present illness.   Otherwise, review of systems is positive for none.   All other systems are reviewed and negative.    PHYSICAL EXAM: VS:  BP 104/72   Pulse (!) 48   Ht 6' 2"  (1.88 m)   Wt 178 lb (80.7 kg)   BMI 22.85 kg/m  , BMI Body  mass index is 22.85 kg/m. GEN: Well nourished, well developed, in no acute distress  HEENT: normal  Neck: no JVD, carotid bruits, or masses Cardiac: RRR; no murmurs, rubs, or gallops,no edema  Respiratory:  clear to auscultation bilaterally, normal work of breathing GI: soft, nontender, nondistended, + BS MS: no deformity or atrophy  Skin: warm and dry Neuro:  Strength and sensation are intact Psych: euthymic mood, full affect  EKG:  EKG is ordered today. Personal review of the ekg ordered shows Sinus rhythm, incomplete right bundle-branch block, QRS duration 102 ms, rate  48  Recent Labs: 11/16/2016: BUN 13; Creatinine, Ser 0.76; Hemoglobin 13.2; Platelets 187; Potassium 4.5; Sodium 140; TSH 3.039    Lipid Panel  No results found for: CHOL, TRIG, HDL, CHOLHDL, VLDL, LDLCALC, LDLDIRECT   Wt Readings from Last 3 Encounters:  12/07/16 178 lb (80.7 kg)  11/15/16 167 lb 14.4 oz (76.2 kg)      Other studies Reviewed: Additional studies/ records that were reviewed today include: TTE 11/16/16, outside records - personally reviewed  Review of the above records today demonstrates:  - Left ventricle: The cavity size was normal. Systolic function was   normal. The estimated ejection fraction was in the range of 55%   to 60%. Wall motion was normal; there were no regional wall   motion abnormalities. Left ventricular diastolic function   parameters were normal. - Atrial septum: No defect or patent foramen ovale was identified.   ASSESSMENT AND PLAN:  1.  Paroxysmal atrial fibrillation: Currently at a low risk for stroke. Is tolerating metoprolol well and having less episodes of atrial fibrillation. I did discuss with him further medical management with antiarrhythmic, including flecainide. At this time since he is tolerating his medications well, Shyan Scalisi plan to keep his metoprolol and not start any further antiarrhythmics.  This patients CHA2DS2-VASc Score and unadjusted Ischemic Stroke Rate (% per year) is equal to 0.6 % stroke rate/year from a score of 1  Above score calculated as 1 point each if present [CHF, HTN, DM, Vascular=MI/PAD/Aortic Plaque, Age if 65-74, or Male] Above score calculated as 2 points each if present [Age > 75, or Stroke/TIA/TE]   2. Hypertension: Well-controlled today. No medication changes at this time.    Current medicines are reviewed at length with the patient today.   The patient does not have concerns regarding his medicines.  The following changes were made today:  none  Labs/ tests ordered today include:  Orders Placed  This Encounter  Procedures  . EKG 12-Lead     Disposition:   FU with Michille Mcelrath 3 months  Signed, Kailea Dannemiller Meredith Leeds, MD  12/07/2016 2:31 PM     [facility] [address] [place] [address] [phone] (office) [phone] (fax)

## 2016-12-07 NOTE — Patient Instructions (Signed)
Medication Instructions:    Your physician recommends that you continue on your current medications as directed. Please refer to the Current Medication list given to you today.  - If you need a refill on your cardiac medications before your next appointment, please call your pharmacy.   Labwork:  None ordered  Testing/Procedures:  None ordered  Follow-Up:  Your physician recommends that you schedule a follow-up appointment in: 3 months with Dr. Curt Bears.  Thank you for choosing [facility]!!   Trinidad Curet, RN [phone]

## 2017-01-24 ENCOUNTER — Telehealth: Payer: Self-pay | Admitting: Cardiology

## 2017-01-24 NOTE — Telephone Encounter (Signed)
Patient c/o the frequency of a-fib episodes increasing. Patient said he has symptoms 3-4 times per week lasting 4-5 hours. Patient said that a couple of times, it has prevented him from running in the afternoon. Patient said that he feels his heart racing, has difficulty taking a deep breath and easily fatigued when he is in a-fib. BP ranging around 110/60-70's &  HR in the 60's. No active chest pain, dizziness or chest pain. Patient said that he did not have symptoms of being in a-fib while on the phone with nurse. Patient is requesting a sooner appointment and is willing to be seen in the a-fib clinic on a Thursday or Friday anytime of the day.  [facility] for an appointment. Awaiting call back.

## 2017-01-24 NOTE — Telephone Encounter (Signed)
New message      Talk to a nurse about his AFIB.  Pt has an appt on 02-09-17 but not sure if he should wait that long.  He want to talk to a nurse about this.

## 2017-01-24 NOTE — Telephone Encounter (Signed)
Appointment scheduled for 01/27/17 @11 :00 am with Roderic Palau NP. Patient informed and verbalized understanding.

## 2017-01-27 ENCOUNTER — Encounter (HOSPITAL_COMMUNITY): Payer: Self-pay | Admitting: Nurse Practitioner

## 2017-01-27 ENCOUNTER — Ambulatory Visit (HOSPITAL_COMMUNITY)
Admission: RE | Admit: 2017-01-27 | Discharge: 2017-01-27 | Disposition: A | Discharge status: Home / Self Care | Payer: No Typology Code available for payment source | Source: Ambulatory Visit | Attending: Nurse Practitioner | Admitting: Nurse Practitioner

## 2017-01-27 VITALS — BP 126/74 | HR 52 | Ht 74.0 in | Wt 176.6 lb

## 2017-01-27 DIAGNOSIS — Z79899 Other long term (current) drug therapy: Secondary | ICD-10-CM | POA: Insufficient documentation

## 2017-01-27 DIAGNOSIS — I480 Paroxysmal atrial fibrillation: Secondary | ICD-10-CM

## 2017-01-27 DIAGNOSIS — I10 Essential (primary) hypertension: Secondary | ICD-10-CM | POA: Diagnosis not present

## 2017-01-27 DIAGNOSIS — Z7982 Long term (current) use of aspirin: Secondary | ICD-10-CM | POA: Insufficient documentation

## 2017-01-27 DIAGNOSIS — E7800 Pure hypercholesterolemia, unspecified: Secondary | ICD-10-CM | POA: Diagnosis not present

## 2017-01-27 DIAGNOSIS — I4891 Unspecified atrial fibrillation: Secondary | ICD-10-CM | POA: Diagnosis present

## 2017-01-27 MED ORDER — FLECAINIDE ACETATE 50 MG PO TABS: 50.0000 mg | ORAL_TABLET | Freq: Two times a day (BID) | ORAL | 1 refills | Status: DC

## 2017-01-27 NOTE — Progress Notes (Signed)
Primary Care Physician: Hulan Fess, MD Referring Physician: [facility] triage EP: Dr. Irven Shelling is a 46 y.o. male with a h/o paroxysmal afib in June of this year where he was treated with first onset afib. He was started on metoprolol. It was discussed that he snored with apnea episodes and sleep study was discussed. He was seen f/u with Dr. Curt Bears and his afib burden was low so he was to continue on metoprolol and baby asa with chadsvasc score of 1. If burden did increase flecainide was discussed.  He is being seen in the afib clinic today for increased afib burden in that it occurring  more so with his running. He runs 3-5 miles 3x a week. He is having 3-4 episodes a week,late afternoon that will last 1-5 hours. He has not tried any extra metoprolol for breakthrough afib.He is avoiding excessive caffeine, no alcohol, sleep study is still pending.  Today, he denies symptoms of palpitations, chest pain, shortness of breath, orthopnea, PND, lower extremity edema, dizziness, presyncope, syncope, or neurologic sequela. The patient is tolerating medications without difficulties and is otherwise without complaint today.   Past Medical History:  Diagnosis Date  . Atrial fibrillation with rapid ventricular response ([place]) 11/16/2016  . Essential hypertension 11/16/2016  . Hypercholesteremia   . Hypertension   . New onset atrial fibrillation ([place]) 11/15/2016   No past surgical history on file.  Current Outpatient Prescriptions  Medication Sig Dispense Refill  . aspirin EC 81 MG tablet Take 1 tablet (81 mg total) by mouth daily. 150 tablet 2  . losartan (COZAAR) 50 MG tablet Take 75 mg by mouth daily.  2  . metoprolol tartrate (LOPRESSOR) 25 MG tablet Take 0.5 tablets (12.5 mg total) by mouth 2 (two) times daily. 180 tablet 2  . Misc Natural Products (TART CHERRY ADVANCED PO) Take 2 capsules by mouth daily.    . Multiple Vitamin (MULTIVITAMIN) tablet Take 1 tablet by mouth  daily.    . niacin (SLO-NIACIN) 500 MG tablet Take 1,500 mg by mouth at bedtime.    . Omega 3 1200 MG CAPS Take 2,400 mg by mouth daily.    . flecainide (TAMBOCOR) 50 MG tablet Take 1 tablet (50 mg total) by mouth 2 (two) times daily. 60 tablet 1   No current facility-administered medications for this encounter.     No Known Allergies  Social History   Social History  . Marital status: Married    Spouse name: N/A  . Number of children: N/A  . Years of education: N/A   Occupational History  . Not on file.   Social History Main Topics  . Smoking status: Never Smoker  . Smokeless tobacco: Never Used  . Alcohol use No  . Drug use: No  . Sexual activity: No   Other Topics Concern  . Not on file   Social History Narrative  . No narrative on file    Family History  Problem Relation Age of Onset  . Atrial fibrillation Mother   . Diabetes Father   . Hypertension Father   . Lung cancer Father     ROS- All systems are reviewed and negative except as per the HPI above  Physical Exam: Vitals:   01/27/17 1059  BP: 126/74  Pulse: (!) 52  Weight: 176 lb 9.6 oz (80.1 kg)  Height: 6' 2"  (1.88 m)   Wt Readings from Last 3 Encounters:  01/27/17 176 lb 9.6 oz (80.1 kg)  12/07/16 178 lb (80.7 kg)  11/15/16 167 lb 14.4 oz (76.2 kg)    Labs: Lab Results  Component Value Date   NA 140 11/16/2016   K 4.5 11/16/2016   CL 107 11/16/2016   CO2 25 11/16/2016   GLUCOSE 97 11/16/2016   BUN 13 11/16/2016   CREATININE 0.76 11/16/2016   CALCIUM 9.2 11/16/2016   No results found for: INR No results found for: CHOL, HDL, LDLCALC, TRIG   GEN- The patient is well appearing, alert and oriented x 3 today.   Head- normocephalic, atraumatic Eyes-  Sclera clear, conjunctiva pink Ears- hearing intact Oropharynx- clear Neck- supple, no JVP Lymph- no cervical lymphadenopathy Lungs- Clear to ausculation bilaterally, normal work of breathing Heart- Regular rate and rhythm, no  murmurs, rubs or gallops, PMI not laterally displaced GI- soft, NT, ND, + BS Extremities- no clubbing, cyanosis, or edema MS- no significant deformity or atrophy Skin- no rash or lesion Psych- euthymic mood, full affect Neuro- strength and sensation are intact  EKG-Sinus brady at 52 bpm, Pr int 158 ms, qrs int 98 ms, qtc 403 ms Echo-- Left ventricle: The cavity size was normal. Systolic function was normal. The estimated ejection fraction was in the range of 55% to 60%. Wall motion was normal; there were no regional wall motion abnormalities. Left ventricular diastolic function parameters were normal. - Atrial septum: No defect or patent foramen ovale was identified.   Assessment and Plan: 1. Paroxysmal afib Pt feels burden is increasing Discussed with Dr. Curt Bears and will start flecainide 50 mg bid Continue metoprolol 25 mg 1/2 tab bid  2. Chadsvasc score of 1(htn) Continue baby asa  He will f/u Monday for EKG on flecainide  Pt has been asked not to run until f/u ETT on flecainide, which will be scheduled soon, if EKG looks good on Monday with Flecainide on board  Macgregor Aeschliman C. Salim Forero, [facility] [address] [place], [place] [address] [phone]

## 2017-01-31 ENCOUNTER — Ambulatory Visit (HOSPITAL_COMMUNITY)
Admission: RE | Admit: 2017-01-31 | Discharge: 2017-01-31 | Disposition: A | Discharge status: Home / Self Care | Payer: No Typology Code available for payment source | Source: Ambulatory Visit | Attending: Nurse Practitioner | Admitting: Nurse Practitioner

## 2017-01-31 DIAGNOSIS — I4891 Unspecified atrial fibrillation: Secondary | ICD-10-CM | POA: Diagnosis present

## 2017-01-31 DIAGNOSIS — I4510 Unspecified right bundle-branch block: Secondary | ICD-10-CM | POA: Diagnosis not present

## 2017-01-31 NOTE — Progress Notes (Addendum)
Patient in for repeat EKG after starting flecainide 62m twice a day. DRoderic PalauNP to review EKG.  Pt started on flecainide 50 mg bid. EKG shows SR at 57 bpm, pr int 162 ms, qrs int 110 ms, qtc 412 ms. Tolerating well.  He will have ETT on flecainide this  Wednesday and f/u with Dr. CCurt Bears9/5.

## 2017-02-02 ENCOUNTER — Ambulatory Visit (HOSPITAL_COMMUNITY)
Admission: RE | Admit: 2017-02-02 | Discharge: 2017-02-02 | Disposition: A | Discharge status: Home / Self Care | Payer: No Typology Code available for payment source | Source: Ambulatory Visit | Attending: Nurse Practitioner | Admitting: Nurse Practitioner

## 2017-02-02 DIAGNOSIS — I4891 Unspecified atrial fibrillation: Secondary | ICD-10-CM

## 2017-02-04 ENCOUNTER — Encounter (HOSPITAL_COMMUNITY): Payer: No Typology Code available for payment source

## 2017-02-09 ENCOUNTER — Ambulatory Visit (INDEPENDENT_AMBULATORY_CARE_PROVIDER_SITE_OTHER): Payer: No Typology Code available for payment source | Admitting: Cardiology

## 2017-02-09 ENCOUNTER — Encounter: Payer: Self-pay | Admitting: Cardiology

## 2017-02-09 VITALS — BP 122/72 | HR 59 | Ht 74.0 in | Wt 178.2 lb

## 2017-02-09 DIAGNOSIS — I480 Paroxysmal atrial fibrillation: Secondary | ICD-10-CM

## 2017-02-09 DIAGNOSIS — I10 Essential (primary) hypertension: Secondary | ICD-10-CM

## 2017-02-09 NOTE — Patient Instructions (Addendum)
Medication Instructions: Your physician has recommended you make the following change in your medication:  1. STOP Aspirin  Labwork: None ordered  Procedures/Testing: None ordered  Follow-Up: Your physician wants you to follow-up in: 6 months with Dr. Curt Bears.  You will receive a reminder letter in the mail two months in advance. If you don't receive a letter, please call our office to schedule the follow-up appointment.   Any Additional Special Instructions Will Be Listed Below (If Applicable).   Cardiac Ablation Cardiac ablation is a procedure to disable (ablate) a small amount of heart tissue in very specific places. The heart has many electrical connections. Sometimes these connections are abnormal and can cause the heart to beat very fast or irregularly. Ablating some of the problem areas can improve the heart rhythm or return it to normal. Ablation may be done for people who:  Have Wolff-Parkinson-White syndrome.  Have fast heart rhythms (tachycardia).  Have taken medicines for an abnormal heart rhythm (arrhythmia) that were not effective or caused side effects.  Have a high-risk heartbeat that may be life-threatening.  During the procedure, a small incision is made in the neck or the groin, and a long, thin, flexible tube (catheter) is inserted into the incision and moved to the heart. Small devices (electrodes) on the tip of the catheter will send out electrical currents. A type of X-ray (fluoroscopy) will be used to help guide the catheter and to provide images of the heart. Tell a health care provider about:  Any allergies you have.  All medicines you are taking, including vitamins, herbs, eye drops, creams, and over-the-counter medicines.  Any problems you or family members have had with anesthetic medicines.  Any blood disorders you have.  Any surgeries you have had.  Any medical conditions you have, such as kidney failure.  Whether you are pregnant or may be  pregnant. What are the risks? Generally, this is a safe procedure. However, problems may occur, including:  Infection.  Bruising and bleeding at the catheter insertion site.  Bleeding into the chest, especially into the sac that surrounds the heart. This is a serious complication.  Stroke or blood clots.  Damage to other structures or organs.  Allergic reaction to medicines or dyes.  Need for a permanent pacemaker if the normal electrical system is damaged. A pacemaker is a small computer that sends electrical signals to the heart and helps your heart beat normally.  The procedure not being fully effective. This may not be recognized until months later. Repeat ablation procedures are sometimes required.  What happens before the procedure?  Follow instructions from your health care provider about eating or drinking restrictions.  Ask your health care provider about: ? Changing or stopping your regular medicines. This is especially important if you are taking diabetes medicines or blood thinners. ? Taking medicines such as aspirin and ibuprofen. These medicines can thin your blood. Do not take these medicines before your procedure if your health care provider instructs you not to.  Plan to have someone take you home from the hospital or clinic.  If you will be going home right after the procedure, plan to have someone with you for 24 hours. What happens during the procedure?  To lower your risk of infection: ? Your health care team will wash or sanitize their hands. ? Your skin will be washed with soap. ? Hair may be removed from the incision area.  An IV tube will be inserted into one of your veins.  You will be given a medicine to help you relax (sedative).  The skin on your neck or groin will be numbed.  An incision will be made in your neck or your groin.  A needle will be inserted through the incision and into a large vein in your neck or groin.  A catheter will be  inserted into the needle and moved to your heart.  Dye may be injected through the catheter to help your surgeon see the area of the heart that needs treatment.  Electrical currents will be sent from the catheter to ablate heart tissue in desired areas. There are three types of energy that may be used to ablate heart tissue: ? Heat (radiofrequency energy). ? Laser energy. ? Extreme cold (cryoablation).  When the necessary tissue has been ablated, the catheter will be removed.  Pressure will be held on the catheter insertion area to prevent excessive bleeding.  A bandage (dressing) will be placed over the catheter insertion area. The procedure may vary among health care providers and hospitals. What happens after the procedure?  Your blood pressure, heart rate, breathing rate, and blood oxygen level will be monitored until the medicines you were given have worn off.  Your catheter insertion area will be monitored for bleeding. You will need to lie still for a few hours to ensure that you do not bleed from the catheter insertion area.  Do not drive for 24 hours or as long as directed by your health care provider. Summary  Cardiac ablation is a procedure to disable (ablate) a small amount of heart tissue in very specific places. Ablating some of the problem areas can improve the heart rhythm or return it to normal.  During the procedure, electrical currents will be sent from the catheter to ablate heart tissue in desired areas. This information is not intended to replace advice given to you by your health care provider. Make sure you discuss any questions you have with your health care provider. Document Released: 10/10/2008 Document Revised: 04/12/2016 Document Reviewed: 04/12/2016 Elsevier Interactive Patient Education  [employer].    If you need a refill on your cardiac medications before your next appointment, please call your pharmacy.

## 2017-02-09 NOTE — Progress Notes (Signed)
Electrophysiology Office Note   Date:  02/09/2017   ID:  Wirt Hemmerich, DOB Jan 02, 1971, MRN [id number]  PCP:  Hulan Fess, MD  Cardiologist: N[place]ser Primary Electrophysiologist:  Micajah Dennin Meredith Leeds, MD    Chief Complaint  Patient presents with  . Follow-up    PAF     History of Present Illness: Johnathan Walker is a 46 y.o. male who is being seen today for the evaluation of atrial fibrillation at the request of Hulan Fess, MD. Presenting today for electrophysiology evaluation. He was hospitalized June 11th 2018 with new onset atrial fibrillation and rapid ventricular response. He was put on metoprolol. Echo at the time showed a normal ejection fraction of 55-60%. After being seen, he continued to have episodes of palpitations. He was put on 50 mg flecainide.   Today, denies symptoms of palpitations, chest pain, shortness of breath, orthopnea, PND, lower extremity edema, claudication, dizziness, presyncope, syncope, bleeding, or neurologic sequela. The patient is tolerating medications without difficulties. He is felt well since increasing his flecainide.     Past Medical History:  Diagnosis Date  . Atrial fibrillation with rapid ventricular response ([place]) 11/16/2016  . Essential hypertension 11/16/2016  . Hypercholesteremia   . Hypertension   . New onset atrial fibrillation ([place]) 11/15/2016   No past surgical history on file.   Current Outpatient Prescriptions  Medication Sig Dispense Refill  . flecainide (TAMBOCOR) 50 MG tablet Take 1 tablet (50 mg total) by mouth 2 (two) times daily. 60 tablet 1  . losartan (COZAAR) 50 MG tablet Take 75 mg by mouth daily.  2  . metoprolol tartrate (LOPRESSOR) 25 MG tablet Take 0.5 tablets (12.5 mg total) by mouth 2 (two) times daily. 180 tablet 2  . Misc Natural Products (TART CHERRY ADVANCED PO) Take 2 capsules by mouth daily.    . Multiple Vitamin (MULTIVITAMIN) tablet Take 1 tablet by mouth daily.    . niacin (SLO-NIACIN) 500 MG tablet  Take 1,500 mg by mouth at bedtime.    . Omega 3 1200 MG CAPS Take 2,400 mg by mouth daily.     No current facility-administered medications for this visit.     Allergies:   Patient has no known allergies.   Social History:  The patient  reports that he has never smoked. He has never used smokeless tobacco. He reports that he does not drink alcohol or use drugs.   Family History:  The patient's family history includes Atrial fibrillation in his mother; Diabetes in his father; Hypertension in his father; Lung cancer in his father.    ROS:  Please see the history of present illness.   Otherwise, review of systems is positive for none.   All other systems are reviewed and negative.   PHYSICAL EXAM: VS:  BP 122/72   Pulse (!) 59   Ht 6' 2"  (1.88 m)   Wt 178 lb 3.2 oz (80.8 kg)   SpO2 98%   BMI 22.88 kg/m  , BMI Body mass index is 22.88 kg/m. GEN: Well nourished, well developed, in no acute distress  HEENT: normal  Neck: no JVD, carotid bruits, or masses Cardiac: RRR; no murmurs, rubs, or gallops,no edema  Respiratory:  clear to auscultation bilaterally, normal work of breathing GI: soft, nontender, nondistended, + BS MS: no deformity or atrophy  Skin: warm and dry Neuro:  Strength and sensation are intact Psych: euthymic mood, full affect  EKG:  EKG is not ordered today. Personal review of the ekg ordered 01/31/17 shows  SR, rate 57, iRBBB   Recent Labs: 11/16/2016: BUN 13; Creatinine, Ser 0.76; Hemoglobin 13.2; Platelets 187; Potassium 4.5; Sodium 140; TSH 3.039    Lipid Panel  No results found for: CHOL, TRIG, HDL, CHOLHDL, VLDL, LDLCALC, LDLDIRECT   Wt Readings from Last 3 Encounters:  02/09/17 178 lb 3.2 oz (80.8 kg)  01/27/17 176 lb 9.6 oz (80.1 kg)  12/07/16 178 lb (80.7 kg)      Other studies Reviewed: Additional studies/ records that were reviewed today include: TTE 11/16/16, outside records - personally reviewed  Review of the above records today demonstrates:    - Left ventricle: The cavity size was normal. Systolic function was   normal. The estimated ejection fraction was in the range of 55%   to 60%. Wall motion was normal; there were no regional wall   motion abnormalities. Left ventricular diastolic function   parameters were normal. - Atrial septum: No defect or patent foramen ovale was identified.   ASSESSMENT AND PLAN:  1.  Paroxysmal atrial fibrillation: Currently low risk for stroke. Have stopped his aspirin. He is on flecainide and metoprolol and has been in sinus rhythm. I did discuss with him the possibility of ablation in the future. He would like to think about this and Ronell Boldin get back to [place] at a later date.  This patients CHA2DS2-VASc Score and unadjusted Ischemic Stroke Rate (% per year) is equal to 0.6 % stroke rate/year from a score of 1  Above score calculated as 1 point each if present [CHF, HTN, DM, Vascular=MI/PAD/Aortic Plaque, Age if 65-74, or Male] Above score calculated as 2 points each if present [Age > 75, or Stroke/TIA/TE]  2. Hypertension: Well-controlled today. No changes.    Current medicines are reviewed at length with the patient today.   The patient does not have concerns regarding his medicines.  The following changes were made today:  Stop aspirin  Labs/ tests ordered today include:  No orders of the defined types were placed in this encounter.    Disposition:   FU with Jahnasia Tatum 6 months  Signed, Nigel Wessman Meredith Leeds, MD  02/09/2017 9:51 AM     [facility] [address] [place] [address] [phone] (office) [phone] (fax)

## 2017-02-17 ENCOUNTER — Telehealth: Payer: Self-pay | Admitting: Cardiology

## 2017-02-17 MED ORDER — FLECAINIDE ACETATE 50 MG PO TABS: 100.0000 mg | ORAL_TABLET | Freq: Two times a day (BID) | ORAL | 6 refills | Status: DC

## 2017-02-17 NOTE — Telephone Encounter (Signed)
Advised pt to increase Flecainide to 100 mg BID. Pt will stop by the office next Thursday for EKG post medication increase. Patient verbalized understanding and agreeable to plan.

## 2017-02-17 NOTE — Telephone Encounter (Signed)
Increase flecainide to 100 mg BID.

## 2017-02-17 NOTE — Telephone Encounter (Signed)
Patient reports being in AFib since 9/11, 2pm. When he walks his HR goes up quickly, but comes down quickly when resting. Skipped his nightly run last night to avoid any issues. States his heart is "not doing what is normally does" and wondering if he can increase any medication/s to see if them makes improvement. Will forward to Dr. Curt Bears to advise. Pt understands I will contact him once reviewed by physician.

## 2017-02-17 NOTE — Telephone Encounter (Signed)
New Message  Pt states he is in Afib and would like to speak with RN .please call back to discuss

## 2017-02-18 ENCOUNTER — Encounter (HOSPITAL_COMMUNITY): Payer: Self-pay

## 2017-02-18 ENCOUNTER — Emergency Department (HOSPITAL_COMMUNITY)
Admission: EM | Admit: 2017-02-18 | Discharge: 2017-02-18 | Disposition: A | Discharge status: Home / Self Care | Payer: No Typology Code available for payment source | Attending: Emergency Medicine | Admitting: Emergency Medicine

## 2017-02-18 ENCOUNTER — Emergency Department (HOSPITAL_COMMUNITY): Payer: No Typology Code available for payment source

## 2017-02-18 DIAGNOSIS — S0990XA Unspecified injury of head, initial encounter: Secondary | ICD-10-CM

## 2017-02-18 DIAGNOSIS — I10 Essential (primary) hypertension: Secondary | ICD-10-CM | POA: Insufficient documentation

## 2017-02-18 DIAGNOSIS — I484 Atypical atrial flutter: Secondary | ICD-10-CM

## 2017-02-18 DIAGNOSIS — I4892 Unspecified atrial flutter: Secondary | ICD-10-CM | POA: Diagnosis not present

## 2017-02-18 DIAGNOSIS — Y999 Unspecified external cause status: Secondary | ICD-10-CM | POA: Insufficient documentation

## 2017-02-18 DIAGNOSIS — W0110XA Fall on same level from slipping, tripping and stumbling with subsequent striking against unspecified object, initial encounter: Secondary | ICD-10-CM | POA: Insufficient documentation

## 2017-02-18 DIAGNOSIS — R51 Headache: Secondary | ICD-10-CM | POA: Diagnosis not present

## 2017-02-18 DIAGNOSIS — R55 Syncope and collapse: Secondary | ICD-10-CM | POA: Diagnosis present

## 2017-02-18 DIAGNOSIS — Y9302 Activity, running: Secondary | ICD-10-CM | POA: Diagnosis not present

## 2017-02-18 DIAGNOSIS — Y929 Unspecified place or not applicable: Secondary | ICD-10-CM | POA: Insufficient documentation

## 2017-02-18 DIAGNOSIS — Z79899 Other long term (current) drug therapy: Secondary | ICD-10-CM | POA: Diagnosis not present

## 2017-02-18 LAB — CBC
HEMATOCRIT: 40.6 % (ref 39.0–52.0)
HEMOGLOBIN: 13.9 g/dL (ref 13.0–17.0)
MCH: 31 pg (ref 26.0–34.0)
MCHC: 34.2 g/dL (ref 30.0–36.0)
MCV: 90.4 fL (ref 78.0–100.0)
Platelets: 205 10*3/uL (ref 150–400)
RBC: 4.49 MIL/uL (ref 4.22–5.81)
RDW: 12.3 % (ref 11.5–15.5)
WBC: 10.8 10*3/uL — ABNORMAL HIGH (ref 4.0–10.5)

## 2017-02-18 LAB — CBG MONITORING, ED: Glucose-Capillary: 109 mg/dL — ABNORMAL HIGH (ref 65–99)

## 2017-02-18 LAB — BASIC METABOLIC PANEL
ANION GAP: 9 (ref 5–15)
BUN: 15 mg/dL (ref 6–20)
CO2: 25 mmol/L (ref 22–32)
Calcium: 9.6 mg/dL (ref 8.9–10.3)
Chloride: 103 mmol/L (ref 101–111)
Creatinine, Ser: 0.95 mg/dL (ref 0.61–1.24)
GFR calc non Af Amer: >60 mL/min (ref >60)
GLUCOSE: 111 mg/dL — AB (ref 65–99)
POTASSIUM: 3.7 mmol/L (ref 3.5–5.1)
Sodium: 137 mmol/L (ref 135–145)

## 2017-02-18 LAB — I-STAT TROPONIN, ED: Troponin i, poc: 0 ng/mL (ref 0.00–0.08)

## 2017-02-18 MED ORDER — METOPROLOL TARTRATE 25 MG PO TABS: 25.0000 mg | ORAL_TABLET | Freq: Two times a day (BID) | ORAL | 0 refills | Status: DC

## 2017-02-18 MED ORDER — FLECAINIDE ACETATE 50 MG PO TABS: 50.0000 mg | ORAL_TABLET | Freq: Two times a day (BID) | ORAL | 0 refills | Status: DC

## 2017-02-18 MED ORDER — PROPOFOL 10 MG/ML IV BOLUS
1.0000 mg/kg | Freq: Once | INTRAVENOUS | Status: DC
  Filled 2017-02-18: qty 20

## 2017-02-18 NOTE — ED Provider Notes (Signed)
[facility] DEPT Provider Note   CSN: [id number] Arrival date & time: 02/18/17  1725     History   Chief Complaint Chief Complaint  Patient presents with  . Loss of Consciousness    HPI Johnathan Walker is a 46 y.o. male with recent diagnosis of atrial fibrillation with RVR who presents following a syncopal event. Patient reports he was running for the first time since increase of his flecainide from 50-100 mg. He thinks he remembers slowing down to walk but the next thing he remembers is being on the ground. He hit his head and sustained a head laceration back of his scalp. He walked back to the care and drove himself home. This all occurred around 1:30 or 2:00 PM today. He was evaluated by his primary care provider prior to arrival who repaired his laceration with staples. He believes his tetanus is up-to-date. Patient denies ever feeling lightheaded or dizzy. He does note mild headache mostly centralized to where the laceration is. Patient is taking metoprolol, flecainide. He denies any chest pain, shortness of breath, vision changes, abdominal pain, nausea, vomiting. Patient reports he was running today and watching his heart rates and not letting it exceed 160 beats for minute. He reports he slowed down to a walk in until his heart rate 120. He would then resume running. Not on anticoagulation at this time, recently discontinued from baby aspirin.  HPI  Past Medical History:  Diagnosis Date  . Atrial fibrillation with rapid ventricular response ([place]) 11/16/2016  . Essential hypertension 11/16/2016  . Hypercholesteremia   . Hypertension   . New onset atrial fibrillation ([place]) 11/15/2016    Patient Active Problem List   Diagnosis Date Noted  . Essential hypertension 11/16/2016  . Atrial fibrillation with rapid ventricular response ([place]) 11/16/2016  . New onset atrial fibrillation ([place]) 11/15/2016    History reviewed. No pertinent surgical history.     Home Medications     Prior to Admission medications   Medication Sig Start Date End Date Taking? Authorizing Provider  flecainide (TAMBOCOR) 50 MG tablet Take 1 tablet (50 mg total) by mouth 2 (two) times daily. 02/18/17   Rachel Rison, Bea Graff, PA-C  losartan (COZAAR) 50 MG tablet Take 75 mg by mouth daily. 10/22/16   [provider]  metoprolol tartrate (LOPRESSOR) 25 MG tablet Take 1 tablet (25 mg total) by mouth 2 (two) times daily. 02/18/17   Michaiah Holsopple, Bea Graff, PA-C  Misc Natural Products (TART CHERRY ADVANCED PO) Take 2 capsules by mouth daily.    [provider]  Multiple Vitamin (MULTIVITAMIN) tablet Take 1 tablet by mouth daily.    [provider]  niacin (SLO-NIACIN) 500 MG tablet Take 1,500 mg by mouth at bedtime.    [provider]  Omega 3 1200 MG CAPS Take 2,400 mg by mouth daily.    [provider]    Family History Family History  Problem Relation Age of Onset  . Atrial fibrillation Mother   . Diabetes Father   . Hypertension Father   . Lung cancer Father     Social History Social History  Substance Use Topics  . Smoking status: Never Smoker  . Smokeless tobacco: Never Used  . Alcohol use No     Allergies   Patient has no known allergies.   Review of Systems Review of Systems  Constitutional: Negative for chills and fever.  HENT: Negative for facial swelling and sore throat.   Respiratory: Negative for shortness of breath.  Cardiovascular: Negative for chest pain.  Gastrointestinal: Negative for abdominal pain, nausea and vomiting.  Genitourinary: Negative for dysuria.  Musculoskeletal: Negative for back pain.  Skin: Positive for wound. Negative for rash.  Neurological: Positive for syncope and headaches. Negative for dizziness and light-headedness.  Psychiatric/Behavioral: The patient is not nervous/anxious.      Physical Exam Updated Vital Signs BP 125/79   Pulse (!) 104   Temp 97.8 F (36.6 C)   Resp 16   Wt 80.7 kg (178  lb)   SpO2 100%   BMI 22.85 kg/m   Physical Exam  Constitutional: He appears well-developed and well-nourished. No distress.  HENT:  Head: Normocephalic and atraumatic.    Mouth/Throat: Oropharynx is clear and moist. No oropharyngeal exudate.  Eyes: Pupils are equal, round, and reactive to light. Conjunctivae are normal. Right eye exhibits no discharge. Left eye exhibits no discharge. No scleral icterus.  Neck: Normal range of motion. Neck supple. No thyromegaly present.  Cardiovascular: Normal rate, regular rhythm, normal heart sounds and intact distal pulses.  Exam reveals no gallop and no friction rub.   No murmur heard. Pulmonary/Chest: Effort normal and breath sounds normal. No stridor. No respiratory distress. He has no wheezes. He has no rales.  Abdominal: Soft. Bowel sounds are normal. He exhibits no distension. There is no tenderness. There is no rebound and no guarding.  Musculoskeletal: He exhibits no edema.  Lymphadenopathy:    He has no cervical adenopathy.  Neurological: He is alert. Coordination normal.  CN 3-12 intact; normal sensation throughout; 5/5 strength in all 4 extremities; equal bilateral grip strength  Skin: Skin is warm and dry. No rash noted. He is not diaphoretic. No pallor.  Psychiatric: He has a normal mood and affect.  Nursing note and vitals reviewed.    ED Treatments / Results  Labs (all labs ordered are listed, but only abnormal results are displayed) Labs Reviewed  BASIC METABOLIC PANEL - Abnormal; Notable for the following:       Result Value   Glucose, Bld 111 (*)    All other components within normal limits  CBC - Abnormal; Notable for the following:    WBC 10.8 (*)    All other components within normal limits  CBG MONITORING, ED - Abnormal; Notable for the following:    Glucose-Capillary 109 (*)    All other components within normal limits  URINALYSIS, ROUTINE W REFLEX MICROSCOPIC  I-STAT TROPONIN, ED    EKG  EKG  Interpretation  Date/Time:  Friday February 18 2017 17:28:37 EDT Ventricular Rate:  114 PR Interval:    QRS Duration: 124 QT Interval:  400 QTC Calculation: 551 R Axis:   -105 Text Interpretation:  Atrial flutter with variable A-V block with premature ventricular or aberrantly conducted complexes Right bundle branch block T wave abnormality, consider inferior ischemia Abnormal ECG SINCE LAST TRACING HEART RATE HAS INCREASED Confirmed by Orlie Dakin [id number]) on 02/18/2017 7:09:56 PM       Radiology Ct Head Wo Contrast  Result Date: 02/18/2017 CLINICAL DATA:  Syncope episode while running, injury to the posterior head. Head pain. EXAM: CT HEAD WITHOUT CONTRAST TECHNIQUE: Contiguous axial images were obtained from the base of the skull through the vertex without intravenous contrast. COMPARISON:  None. FINDINGS: Brain: The brainstem, cerebellum, cerebral peduncles, thalami, basal ganglia, basilar cisterns, and ventricular system appear within normal limits. No intracranial hemorrhage, mass lesion, or acute CVA. Vascular: Unremarkable Skull: Unremarkable Sinuses/Orbits: Opacification of a right ethmoid air cell compatible with  mild chronic sinusitis. Other:  Staples along the right posterior scalp. IMPRESSION: 1. No acute intracranial findings. 2. Minimal chronic ethmoid sinusitis. 3. Staples noted along the right posterior scalp. Electronically Signed   By: Van Clines M.D.   On: 02/18/2017 19:08    Procedures Procedures (including critical care time)  Medications Ordered in ED Medications - No data to display   Initial Impression / Assessment and Plan / ED Course  I have reviewed the triage vital signs and the nursing notes.  Pertinent labs & imaging results that were available during my care of the patient were reviewed by me and considered in my medical decision making (see chart for details).     CHA2DS2/VAS Stroke Risk 1  Patient in atrial flutter. WBC 10.8, otherwise  labs unremarkable. CT head is negative. I spoke with cardiologist, Dr. Sallyanne Kuster, who recommended cardioversion and one month of anticoagulation. He suspects atrial flutter with 1:1 AV conduction in the setting of rapid ventricular rates while running causing the patient to have syncopal episode. However, after Dr. Sallyanne Kuster evaluated and spoke with the patient, he declined cardioversion. Instead, Dr. Sallyanne Kuster will half the dose of Flecainide (100 to 50 mg BID) and double metoprolol dose (12.5 to 25 mg BID) and resume baby aspirin. Patient has follow-up with Dr. Curt Bears next week. He is advised not to engage in any physical exertion until this follow-up. Return precautions discussed. Patient understands and agrees with plan. Patient discharged in satisfactory condition. Patient also evaluated by Dr. Winfred Leeds who guided the patient's management and agrees with plan.   Final Clinical Impressions(s) / ED Diagnoses   Final diagnoses:  Atrial flutter, paroxysmal ([place])  Minor head injury, initial encounter    New Prescriptions Discharge Medication List as of 02/18/2017  7:46 PM       Frederica Kuster, PA-C 02/18/17 [place], [place], MD 02/19/17 [phone]

## 2017-02-18 NOTE — ED Triage Notes (Signed)
Pt presents to the ed after having a syncopal episode while running today.  Pt states that he has a history of atrial fib and is not sure if that's what happened. He went to his doctor and they sent him here for further evaluation. Pt is now alert and oriented, denies any dizziness, denies chest pain.  Encouraged if he begins to feel dizzy again to please let staff know. Pt has a laceration on the back of his head, bleeding controlled. Pt is not on blood thinners.

## 2017-02-18 NOTE — ED Notes (Signed)
On way to CT 

## 2017-02-18 NOTE — ED Provider Notes (Signed)
She had syncopal event either while running or immediately after stopping running this afternoon. He fell striking his head. He is presently asymptomatic he felt well during running. On exam he is alert and in no distress heart regular rate and rhythm mildly tachycardic lungs clear auscultation   Johnathan Dakin, MD 02/18/17 1910

## 2017-02-18 NOTE — ED Notes (Signed)
Pt given urinal for specimen.

## 2017-02-18 NOTE — Discharge Instructions (Signed)
Take half your dose of flecainide and double the dose of metoprolol. Still take each twice daily. Resume taking baby aspirin. Do NOT run or participate in any activities to increase your heart rate. Please return to emergency department if you develop any new or worsening symptoms.

## 2017-02-18 NOTE — Consult Note (Signed)
Cardiology Consultation:   Patient ID: Johnathan Walker; [id number]; 1971-02-13   Admit date: 02/18/2017 Date of Consult: 02/18/2017  Primary Care Provider: Hulan Fess, MD Primary Cardiologist: Curt Bears Primary Electrophysiologist:  Curt Bears   Patient Profile:   Johnathan Walker is a 46 y.o. male with a hx of atrial fibrillation  who is being seen today for the evaluation of syncope at the request of Dr. Winfred Leeds.  History of Present Illness:   Johnathan Walker has had numerous episodes of paroxysmal atrial fibrillation rapid ventricular response. He had partial improvement on flecainide 50 mg twice daily, but continued to have palpitations. His dose of flecainide was recently increased 100 mg twice daily. A couple weeks ago while still on 50 mg twice daily he underwent a treadmill stress test with good results. He has not had a repeat stress test since increasing the dose of flecainide. He also takes a low dose of beta blocker, metoprolol 12.5 mg twice a day. previous workup has shown no evidence of structural heart disease. He is not taking any anticoagulants and has even stopped aspirin. CHADSVasc 0.  He is very athletic. Today he went running for the first time since increasing his dose of antiarrhythmic and developed abrupt syncope. He had a head injury, but CT does not show any evidence of intracranial bleeding.is never experienced syncope before. He denies chest pain or shortness of breath, he is only vaguely aware of the irregular heartbeat. He does not have any other health complaints recently.  ECG shows atypical atrial flutter with a relatively long cycle length and atrial rate of around 240-250 bpm, with variable AV block and a ventricular rate of about 110 bpm at rest.  Past Medical History:  Diagnosis Date  . Atrial fibrillation with rapid ventricular response ([place]) 11/16/2016  . Essential hypertension 11/16/2016  . Hypercholesteremia   . Hypertension   . New onset atrial  fibrillation ([place]) 11/15/2016    History reviewed. No pertinent surgical history.   Home Medications:  Prior to Admission medications   Medication Sig Start Date End Date Taking? Authorizing Provider  flecainide (TAMBOCOR) 50 MG tablet Take 1 tablet (50 mg total) by mouth 2 (two) times daily. 02/18/17   Law, Bea Graff, PA-C  losartan (COZAAR) 50 MG tablet Take 75 mg by mouth daily. 10/22/16   [provider]  metoprolol tartrate (LOPRESSOR) 25 MG tablet Take 1 tablet (25 mg total) by mouth 2 (two) times daily. 02/18/17   Law, Bea Graff, PA-C  Misc Natural Products (TART CHERRY ADVANCED PO) Take 2 capsules by mouth daily.    [provider]  Multiple Vitamin (MULTIVITAMIN) tablet Take 1 tablet by mouth daily.    [provider]  niacin (SLO-NIACIN) 500 MG tablet Take 1,500 mg by mouth at bedtime.    [provider]  Omega 3 1200 MG CAPS Take 2,400 mg by mouth daily.    [provider]    Inpatient Medications: Scheduled Meds:  Continuous Infusions:  PRN Meds:   Allergies:   No Known Allergies  Social History:   Social History   Social History  . Marital status: Married    Spouse name: N/A  . Number of children: N/A  . Years of education: N/A   Occupational History  . Not on file.   Social History Main Topics  . Smoking status: Never Smoker  . Smokeless tobacco: Never Used  . Alcohol use No  . Drug use: No  . Sexual activity: No   Other  Topics Concern  . Not on file   Social History Narrative  . No narrative on file    Family History:    Family History  Problem Relation Age of Onset  . Atrial fibrillation Mother   . Diabetes Father   . Hypertension Father   . Lung cancer Father      ROS:  Please see the history of present illness.  ROS  All other ROS reviewed and negative.     Physical Exam/Data:   Vitals:   02/18/17 1730 02/18/17 1830 02/18/17 1915  BP: (!) 153/89 133/88 122/89  Pulse: (!) 117  (!) 101   Resp: 18 15 19   Temp: 97.8 F (36.6 C)    SpO2: 100%  95%  Weight: 178 lb (80.7 kg)     No intake or output data in the 24 hours ending 02/18/17 1936 Filed Weights   02/18/17 1730  Weight: 178 lb (80.7 kg)   Body mass index is 22.85 kg/m.  General:  Well nourished, well developed, in no acute distress HEENT: normal. Small occipital laceration Lymph: no adenopathy Neck: no JVD Endocrine:  No thryomegaly Vascular: No carotid bruits; FA pulses 2+ bilaterally without bruits  Cardiac:  normal S1, S2; irregular; no murmur  Lungs:  clear to auscultation bilaterally, no wheezing, rhonchi or rales  Abd: soft, nontender, no hepatomegaly  Ext: no edema Musculoskeletal:  No deformities, BUE and BLE strength normal and equal Skin: warm and dry  Neuro:  CNs 2-12 intact, no focal abnormalities noted Psych:  Normal affect   EKG:  The EKG was personally reviewed and demonstrates:  Atypical atrial flutter with variable AV block Telemetry:  Telemetry was personally reviewed and demonstrates:  Atypical atrial flutter with variable AV block  Relevant CV Studies: Previous echo and treadmill stress test  Laboratory Data:  Chemistry Recent Labs Lab 02/18/17 1739  NA 137  K 3.7  CL 103  CO2 25  GLUCOSE 111*  BUN 15  CREATININE 0.95  CALCIUM 9.6  GFRNONAA >60  GFRAA >60  ANIONGAP 9    No results for input(s): PROT, ALBUMIN, AST, ALT, ALKPHOS, BILITOT in the last 168 hours. Hematology Recent Labs Lab 02/18/17 1739  WBC 10.8*  RBC 4.49  HGB 13.9  HCT 40.6  MCV 90.4  MCH 31.0  MCHC 34.2  RDW 12.3  PLT 205   Cardiac EnzymesNo results for input(s): TROPONINI in the last 168 hours.  Recent Labs Lab 02/18/17 1757  TROPIPOC 0.00    BNPNo results for input(s): BNP, PROBNP in the last 168 hours.  DDimer No results for input(s): DDIMER in the last 168 hours.  Radiology/Studies:  Ct Head Wo Contrast  Result Date: 02/18/2017 CLINICAL DATA:  Syncope episode while running,  injury to the posterior head. Head pain. EXAM: CT HEAD WITHOUT CONTRAST TECHNIQUE: Contiguous axial images were obtained from the base of the skull through the vertex without intravenous contrast. COMPARISON:  None. FINDINGS: Brain: The brainstem, cerebellum, cerebral peduncles, thalami, basal ganglia, basilar cisterns, and ventricular system appear within normal limits. No intracranial hemorrhage, mass lesion, or acute CVA. Vascular: Unremarkable Skull: Unremarkable Sinuses/Orbits: Opacification of a right ethmoid air cell compatible with mild chronic sinusitis. Other:  Staples along the right posterior scalp. IMPRESSION: 1. No acute intracranial findings. 2. Minimal chronic ethmoid sinusitis. 3. Staples noted along the right posterior scalp. Electronically Signed   By: Van Clines M.D.   On: 02/18/2017 19:08    Assessment and Plan:   1. Syncope: strong  suspicion for atrial flutter with 1:1 AV conduction and very rapid ventricular rates leading to syncope, in the setting of increased adrenergic state during exercise.  2. Aflutter: He should avoid running until he has repeat evaluation. Reduce the dose of flecainide to 50 mg twice a day and increase the beta blocker dose to 25 mg twice a day. Consider switching to an alternative antiarrhythmic such as multaq or undergoing radiofrequency ablation. Restart low-dose aspirin 81 mg daily. I offered on-site urgent cardioversion for resolution of the atrial flutter but he declined. He seems to be concerned both by the risks of the procedure, but also by the high likelihood of arrhythmia recurrence and the need to take anticoagulation. He promises not to engage in physical exertion until his follow-up appointment. He inquires about the usefulness of a commercially available rhythm monitor such as the Kardia device and I think that would be a great idea. Ultimately, I think he will be an excellent candidate for radiofrequency ablation. Discussed the situation  with his electrophysiologist who also felt that cardioversion would be indicated and agrees with a reduced dose of flecainide.nevertheless, Mr. Nance would rather follow a more conservative approach.   For questions or updates, please contact [place] Please consult [email or web address] for contact info under Cardiology/STEMI.   Signed, Sanda Klein, MD  02/18/2017 7:36 PM

## 2017-02-19 LAB — I-STAT TROPONIN, ED: TROPONIN I, POC: 0 ng/mL (ref 0.00–0.08)

## 2017-02-21 ENCOUNTER — Ambulatory Visit (HOSPITAL_COMMUNITY)
Admission: RE | Admit: 2017-02-21 | Discharge: 2017-02-21 | Disposition: A | Discharge status: Home / Self Care | Payer: No Typology Code available for payment source | Source: Ambulatory Visit | Attending: Nurse Practitioner | Admitting: Nurse Practitioner

## 2017-02-21 ENCOUNTER — Encounter (HOSPITAL_COMMUNITY): Payer: Self-pay | Admitting: Nurse Practitioner

## 2017-02-21 ENCOUNTER — Encounter: Payer: Self-pay | Admitting: *Deleted

## 2017-02-21 ENCOUNTER — Telehealth: Payer: Self-pay | Admitting: *Deleted

## 2017-02-21 VITALS — BP 116/74 | HR 85 | Ht 74.0 in | Wt 176.6 lb

## 2017-02-21 DIAGNOSIS — R55 Syncope and collapse: Secondary | ICD-10-CM | POA: Diagnosis not present

## 2017-02-21 DIAGNOSIS — I480 Paroxysmal atrial fibrillation: Secondary | ICD-10-CM | POA: Diagnosis present

## 2017-02-21 DIAGNOSIS — Z801 Family history of malignant neoplasm of trachea, bronchus and lung: Secondary | ICD-10-CM | POA: Diagnosis not present

## 2017-02-21 DIAGNOSIS — I4892 Unspecified atrial flutter: Secondary | ICD-10-CM | POA: Diagnosis not present

## 2017-02-21 DIAGNOSIS — E7800 Pure hypercholesterolemia, unspecified: Secondary | ICD-10-CM | POA: Diagnosis not present

## 2017-02-21 DIAGNOSIS — Z01812 Encounter for preprocedural laboratory examination: Secondary | ICD-10-CM

## 2017-02-21 DIAGNOSIS — Z7901 Long term (current) use of anticoagulants: Secondary | ICD-10-CM | POA: Insufficient documentation

## 2017-02-21 DIAGNOSIS — Z833 Family history of diabetes mellitus: Secondary | ICD-10-CM | POA: Insufficient documentation

## 2017-02-21 DIAGNOSIS — Z8249 Family history of ischemic heart disease and other diseases of the circulatory system: Secondary | ICD-10-CM | POA: Diagnosis not present

## 2017-02-21 DIAGNOSIS — I10 Essential (primary) hypertension: Secondary | ICD-10-CM | POA: Insufficient documentation

## 2017-02-21 DIAGNOSIS — Z79899 Other long term (current) drug therapy: Secondary | ICD-10-CM | POA: Insufficient documentation

## 2017-02-21 DIAGNOSIS — I4819 Other persistent atrial fibrillation: Secondary | ICD-10-CM

## 2017-02-21 MED ORDER — RIVAROXABAN 20 MG PO TABS: 20.0000 mg | ORAL_TABLET | Freq: Every day | ORAL | 0 refills | Status: DC

## 2017-02-21 NOTE — Addendum Note (Signed)
Addended by: Stanton Kidney on: 02/21/2017 05:15 PM   Modules accepted: Orders

## 2017-02-21 NOTE — Telephone Encounter (Signed)
Received request from Roderic Palau, NP that pt needed to be scheduled for OV w/ Camnitz and RFA. Scheduled pt for 10/1 w/ Camnitz to discuss procedure and perform blood work prior to procedure. Procedure scheduled for 10/11. CPT: 93656 Letter of instructions mailed to home address. Post ablation appts to be made when he comes in on 10/1. He understands office will call to schedule cardiac CT w/i 7 days prior to procedure. Patient verbalized understanding and agreeable to plan.

## 2017-02-21 NOTE — Patient Instructions (Signed)
Your physician has recommended you make the following change in your medication:  1)Start Xarelto 35m once a day with supper

## 2017-02-21 NOTE — Progress Notes (Signed)
Primary Care Physician: Hulan Fess, MD Referring Physician: [facility] triage EP: Dr. Irven Shelling is a 46 y.o. male with a h/o paroxysmal afib in June of this year where he was treated with first onset afib. He was started on metoprolol. It was discussed that he snored with apnea episodes and sleep study was discussed. He was seen f/u with Dr. Curt Bears and his afib burden was low so he was to continue on metoprolol and baby asa with chadsvasc score of 1. If burden did increase flecainide was discussed.  He was seen in the afib clinic 8/23 for increased afib burden  that  was noted more so with his running. He runs 3-5 miles 3x a week. He is having 3-4 episodes a week,late afternoon that would last 1-5 hours. He had not tried any extra metoprolol for breakthrough afib.He is avoiding excessive caffeine, no alcohol, sleep study is still pending.  He was started on 50 mg flecainide and he had an ETT on flecainide with excellent effort without any EKG interval changes. After that he noted some increase in afib and was told by Dr. Curt Bears office to increase to 100 mg bid and was set up for repeat EKG.  This past Friday, he was running and toward the end of his run. His HR was noted to be around 160 bpm. The next thing he was aware of he had passed out and struck his head.Marland Kitchen He had the laceration closed with staples after presenting to the ER and CT of the head did not show any acute intracranial findings. He refused cardioversion and was told to redcue flecainde back to 50 mg bid.  In the afib clinic 9/17,f/u ER visit, he continues in atrial flutter at 85 bpm, tolerating OK. He is not on antiinflammatory. SInce he is out of the 48 hour window, he will have to be started on xarelto before he could be cardioverted, however, the pt would like to see Dr. Carlis Stable to get scheduled for ablation. He is not going to run anymore until he gets afib further evaluated. He saw Dr. Loletha Grayer , who did consult  in there ER and felt that he may have had 1:1 AV conduction and very rapid v rates leading to syncope.  Today, he denies symptoms of palpitations, chest pain, shortness of breath, orthopnea, PND, lower extremity edema, dizziness,  or neurologic sequela. The patient is tolerating medications without difficulties and is otherwise without complaint today.   Past Medical History:  Diagnosis Date  . Atrial fibrillation with rapid ventricular response ([place]) 11/16/2016  . Essential hypertension 11/16/2016  . Hypercholesteremia   . Hypertension   . New onset atrial fibrillation ([place]) 11/15/2016   No past surgical history on file.  Current Outpatient Prescriptions  Medication Sig Dispense Refill  . flecainide (TAMBOCOR) 50 MG tablet Take 1 tablet (50 mg total) by mouth 2 (two) times daily. 60 tablet 0  . losartan (COZAAR) 50 MG tablet Take 75 mg by mouth daily.  2  . metoprolol tartrate (LOPRESSOR) 25 MG tablet Take 1 tablet (25 mg total) by mouth 2 (two) times daily. 60 tablet 0  . Misc Natural Products (TART CHERRY ADVANCED PO) Take 2 capsules by mouth daily.    . Multiple Vitamin (MULTIVITAMIN) tablet Take 1 tablet by mouth daily.    . niacin (SLO-NIACIN) 500 MG tablet Take 1,500 mg by mouth at bedtime.    . Omega 3 1200 MG CAPS Take 2,400 mg by mouth  daily.    . rivaroxaban (XARELTO) 20 MG TABS tablet Take 1 tablet (20 mg total) by mouth daily with supper. 30 tablet 0   No current facility-administered medications for this encounter.     No Known Allergies  Social History   Social History  . Marital status: Married    Spouse name: N/A  . Number of children: N/A  . Years of education: N/A   Occupational History  . Not on file.   Social History Main Topics  . Smoking status: Never Smoker  . Smokeless tobacco: Never Used  . Alcohol use No  . Drug use: No  . Sexual activity: No   Other Topics Concern  . Not on file   Social History Narrative  . No narrative on file     Family History  Problem Relation Age of Onset  . Atrial fibrillation Mother   . Diabetes Father   . Hypertension Father   . Lung cancer Father     ROS- All systems are reviewed and negative except as per the HPI above  Physical Exam: Vitals:   02/21/17 1428  BP: 116/74  Pulse: 85  Weight: 176 lb 9.6 oz (80.1 kg)  Height: 6' 2"  (1.88 m)   Wt Readings from Last 3 Encounters:  02/21/17 176 lb 9.6 oz (80.1 kg)  02/18/17 178 lb (80.7 kg)  02/09/17 178 lb 3.2 oz (80.8 kg)    Labs: Lab Results  Component Value Date   NA 137 02/18/2017   K 3.7 02/18/2017   CL 103 02/18/2017   CO2 25 02/18/2017   GLUCOSE 111 (H) 02/18/2017   BUN 15 02/18/2017   CREATININE 0.95 02/18/2017   CALCIUM 9.6 02/18/2017   No results found for: INR No results found for: CHOL, HDL, LDLCALC, TRIG   GEN- The patient is well appearing, alert and oriented x 3 today.   Head- normocephalic, atraumatic Eyes-  Sclera clear, conjunctiva pink Ears- hearing intact Oropharynx- clear Neck- supple, no JVP Lymph- no cervical lymphadenopathy Lungs- Clear to ausculation bilaterally, normal work of breathing Heart- irregular rate and rhythm, no murmurs, rubs or gallops, PMI not laterally displaced GI- soft, NT, ND, + BS Extremities- no clubbing, cyanosis, or edema MS- no significant deformity or atrophy Skin- no rash or lesion Psych- euthymic mood, full affect Neuro- strength and sensation are intact  EKG-atrial flutter with variable AV block, IRBBB, qtc 480 ms Echo-- Left ventricle: The cavity size was normal. Systolic function was normal. The estimated ejection fraction was in the range of 55% to 60%. Wall motion was normal; there were no regional wall motion abnormalities. Left ventricular diastolic function parameters were normal. - Atrial septum: No defect or patent foramen ovale was identified.   Assessment and Plan: 1. Syncope with h/o paroxysmal afib CT head negative for  bleed Flecainide was reduced to 50 mg bid and metoprolol was increased to 25 bid in ER Remains in atrial flutter but wants to discuss ablation Can not be cardioverted x 3 weeks as he is out of 48 hour window, deferred in the ER. Also will need to start for possible ablation  Chadsvasc score of 1 (HTN), will stop asa and start xarelto 20 mg daily, crcl cal at [employer] street office notified of pt wanting ablation and he will be called for appointment with Dr. Curt Bears Ablation tentatively penciled in by [facility] for 10/11  Pt has been asked not to run until further evaluated with [employer] C. Kayleen Memos, ANP-C  [facility] [address] [place], [place] [address] [phone]

## 2017-03-04 ENCOUNTER — Encounter: Payer: Self-pay | Admitting: Cardiology

## 2017-03-04 ENCOUNTER — Other Ambulatory Visit: Payer: Self-pay | Admitting: *Deleted

## 2017-03-04 DIAGNOSIS — I480 Paroxysmal atrial fibrillation: Secondary | ICD-10-CM

## 2017-03-07 ENCOUNTER — Encounter (INDEPENDENT_AMBULATORY_CARE_PROVIDER_SITE_OTHER): Payer: Self-pay

## 2017-03-07 ENCOUNTER — Ambulatory Visit (INDEPENDENT_AMBULATORY_CARE_PROVIDER_SITE_OTHER): Payer: No Typology Code available for payment source | Admitting: Cardiology

## 2017-03-07 ENCOUNTER — Encounter: Payer: Self-pay | Admitting: Cardiology

## 2017-03-07 ENCOUNTER — Other Ambulatory Visit: Payer: Self-pay | Admitting: Cardiology

## 2017-03-07 VITALS — BP 116/80 | HR 48 | Ht 74.0 in | Wt 177.8 lb

## 2017-03-07 DIAGNOSIS — Z79899 Other long term (current) drug therapy: Secondary | ICD-10-CM | POA: Diagnosis not present

## 2017-03-07 DIAGNOSIS — Z01812 Encounter for preprocedural laboratory examination: Secondary | ICD-10-CM | POA: Diagnosis not present

## 2017-03-07 DIAGNOSIS — I480 Paroxysmal atrial fibrillation: Secondary | ICD-10-CM | POA: Diagnosis not present

## 2017-03-07 DIAGNOSIS — I10 Essential (primary) hypertension: Secondary | ICD-10-CM | POA: Diagnosis not present

## 2017-03-07 NOTE — Patient Instructions (Addendum)
Medication Instructions:  Your physician recommends that you continue on your current medications as directed. Please refer to the Current Medication list given to you today.  -- If you need a refill on your cardiac medications before your next appointment, please call your pharmacy. --  Labwork: Pre procedure labs today: BMET & CBC w/ diff  Testing/Procedures: None ordered  Follow-Up: Your physician recommends that you schedule a follow-up appointment in: 4 weeks, after your procedure on 03/17/17, with Roderic Palau in the AFib clinic.  Your physician recommends that you schedule a follow-up appointment in: 3 months, after your procedure on 03/17/17, with Dr. Curt Bears.  Thank you for choosing [facility]!!   Trinidad Curet, RN [phone]  Any Other Special Instructions Will Be Listed Below (If Applicable).   CT Instructions: Please arrive at the [facility] main entrance of [facility] at xx:xx AM (30-45 minutes prior to test start time)  [facility] [address] [place], [place] [address] [phone]  Proceed to the [facility] Radiology Department (First Floor).  Please follow these instructions carefully (unless otherwise directed):  Hold all erectile dysfunction medications at least 48 hours prior to test.  On the Night Before the Test: . Drink plenty of water. . Do not consume any caffeinated/decaffeinated beverages or chocolate 12 hours prior to your test. . Do not take any antihistamines 12 hours prior to your test. . If you take Metformin do not take 24 hours prior to test. . If the patient has contrast allergy: ? Patient will need a prescription for Prednisone and very clear instructions (as follows): 1. Prednisone 50 mg - take 13 hours prior to test 2. Take another Prednisone 50 mg 7 hours prior to test 3. Take another Prednisone 50 mg 1 hour prior to test 4. Take Benadryl 50 mg 1 hour prior to test . Patient must complete all  four doses of above prophylactic medications. . Patient will need a ride after test due to Benadryl.  On the Day of the Test: . Drink plenty of water. Do not drink any water within one hour of the test. . Do not eat any food 4 hours prior to the test. . You may take your regular medications prior to the test. . IF NOT ON A BETA BLOCKER - Take 50 mg of lopressor (metoprolol) one hour before the test. . HOLD Furosemide morning of the test.  After the Test: . Drink plenty of water. . After receiving IV contrast, you may experience a mild flushed feeling. This is normal. . On occasion, you may experience a mild rash up to 24 hours after the test. This is not dangerous. If this occurs, you can take Benadryl 25 mg and increase your fluid intake. . If you experience trouble breathing, this can be serious. If it is severe call 911 IMMEDIATELY. If it is mild, please call our office. . If you take any of these medications: Glipizide/Metformin, Avandament, Glucavance, please do not take 48 hours after completing test.

## 2017-03-07 NOTE — Progress Notes (Signed)
Electrophysiology Office Note   Date:  03/07/2017   ID:  Johnathan Walker, DOB 12-12-1970, MRN [id number]  PCP:  Hulan Fess, MD  Cardiologist: Nahser Primary Electrophysiologist:  Quamel Fitzmaurice Meredith Leeds, MD    Chief Complaint  Patient presents with  . office visit    PAF H&P     History of Present Illness: Johnathan Walker is a 46 y.o. male who is being seen today for the evaluation of atrial fibrillation at the request of Hulan Fess, MD. Presenting today for electrophysiology evaluation. He was hospitalized June 11th 2018 with new onset atrial fibrillation and rapid ventricular response. He was put on metoprolol. Echo at the time showed a normal ejection fraction of 55-60%. After being seen, he continued to have episodes of palpitations. He was put on 50 mg flecainide. He presented to the hospital on 9/14 after an episode of syncope. EKG showed atypical atrial flutter with rates of 240-250 and variable AV block. The patient declined cardioversion in the emergency room. He was started on Xarelto.  Today, denies symptoms of palpitations, chest pain, shortness of breath, orthopnea, PND, lower extremity edema, claudication, dizziness, presyncope, syncope, bleeding, or neurologic sequela. The patient is tolerating medications without difficulties. He went back into sinus rhythm 3 days ago. He is currently feeling well without symptoms.    Past Medical History:  Diagnosis Date  . Atrial fibrillation with rapid ventricular response ([place]) 11/16/2016  . Essential hypertension 11/16/2016  . Hypercholesteremia   . Hypertension   . New onset atrial fibrillation ([place]) 11/15/2016   History reviewed. No pertinent surgical history.   Current Outpatient Prescriptions  Medication Sig Dispense Refill  . flecainide (TAMBOCOR) 50 MG tablet Take 1 tablet (50 mg total) by mouth 2 (two) times daily. 60 tablet 0  . losartan (COZAAR) 50 MG tablet Take 75 mg by mouth daily.  2  . metoprolol tartrate  (LOPRESSOR) 25 MG tablet Take 1 tablet (25 mg total) by mouth 2 (two) times daily. 60 tablet 0  . Misc Natural Products (TART CHERRY ADVANCED PO) Take 2 capsules by mouth daily.    . Multiple Vitamin (MULTIVITAMIN) tablet Take 1 tablet by mouth daily.    . niacin (SLO-NIACIN) 500 MG tablet Take 1,500 mg by mouth at bedtime.    . rivaroxaban (XARELTO) 20 MG TABS tablet Take 1 tablet (20 mg total) by mouth daily with supper. 30 tablet 0  . Omega 3 1200 MG CAPS Take 2,400 mg by mouth daily.     No current facility-administered medications for this visit.     Allergies:   Patient has no known allergies.   Social History:  The patient  reports that he has never smoked. He has never used smokeless tobacco. He reports that he does not drink alcohol or use drugs.   Family History:  The patient's family history includes Atrial fibrillation in his mother; Diabetes in his father; Hypertension in his father; Lung cancer in his father.    ROS:  Please see the history of present illness.   Otherwise, review of systems is positive for None.   All other systems are reviewed and negative.   PHYSICAL EXAM: VS:  BP 116/80   Pulse (!) 48   Ht 6' 2"  (1.88 m)   Wt 177 lb 12.8 oz (80.6 kg)   SpO2 98%   BMI 22.83 kg/m  , BMI Body mass index is 22.83 kg/m. GEN: Well nourished, well developed, in no acute distress  HEENT: normal  Neck: no  JVD, carotid bruits, or masses Cardiac: RRR; no murmurs, rubs, or gallops,no edema  Respiratory:  clear to auscultation bilaterally, normal work of breathing GI: soft, nontender, nondistended, + BS MS: no deformity or atrophy  Skin: warm and dry Neuro:  Strength and sensation are intact Psych: euthymic mood, full affect  EKG:  EKG is not ordered today. Personal review of the ekg ordered 02/21/17 shows atrial flutter, LAD, prolonged QTc   Recent Labs: 11/16/2016: TSH 3.039 02/18/2017: BUN 15; Creatinine, Ser 0.95; Hemoglobin 13.9; Platelets 205; Potassium 3.7; Sodium  137    Lipid Panel  No results found for: CHOL, TRIG, HDL, CHOLHDL, VLDL, LDLCALC, LDLDIRECT   Wt Readings from Last 3 Encounters:  03/07/17 177 lb 12.8 oz (80.6 kg)  02/21/17 176 lb 9.6 oz (80.1 kg)  02/18/17 178 lb (80.7 kg)      Other studies Reviewed: Additional studies/ records that were reviewed today include: TTE 11/16/16, outside records - personally reviewed  Review of the above records today demonstrates:  - Left ventricle: The cavity size was normal. Systolic function was   normal. The estimated ejection fraction was in the range of 55%   to 60%. Wall motion was normal; there were no regional wall   motion abnormalities. Left ventricular diastolic function   parameters were normal. - Atrial septum: No defect or patent foramen ovale was identified.   ASSESSMENT AND PLAN:  1.  Paroxysmal atrial fibrillation: Went into atrial flutter and had an episode of syncope, possibly due to rapid atrial flutter after increased dose of flecainide. Has been started on Xarelto. Discussed ablation. Risks and benefits were discussed. Risks include bleeding, tamponade, heart block, stroke, and damage to surrounding organs. He understands these risks and has agreed to the procedure.  This patients CHA2DS2-VASc Score and unadjusted Ischemic Stroke Rate (% per year) is equal to 0.6 % stroke rate/year from a score of 1  Above score calculated as 1 point each if present [CHF, HTN, DM, Vascular=MI/PAD/Aortic Plaque, Age if 65-74, or Male] Above score calculated as 2 points each if present [Age > 75, or Stroke/TIA/TE]  2. Hypertension: Well-controlled today. No changes necessary.    Current medicines are reviewed at length with the patient today.   The patient does not have concerns regarding his medicines.  The following changes were made today:  None  Labs/ tests ordered today include:  Orders Placed This Encounter  Procedures  . Basic Metabolic Panel (BMET)  . CBC w/Diff      Disposition:   FU with Raaga Maeder 1 months  Signed, Raymon Schlarb Meredith Leeds, MD  03/07/2017 4:09 PM     [place] [place] [address] [phone] (office) [phone] (fax)

## 2017-03-08 LAB — CBC WITH DIFFERENTIAL/PLATELET
BASOS: 1 %
Basophils Absolute: 0 10*3/uL (ref 0.0–0.2)
EOS (ABSOLUTE): 0.2 10*3/uL (ref 0.0–0.4)
EOS: 3 %
HEMATOCRIT: 35.8 % — AB (ref 37.5–51.0)
Hemoglobin: 12.7 g/dL — ABNORMAL LOW (ref 13.0–17.7)
IMMATURE GRANS (ABS): 0 10*3/uL (ref 0.0–0.1)
IMMATURE GRANULOCYTES: 0 %
LYMPHS: 47 %
Lymphocytes Absolute: 2.5 10*3/uL (ref 0.7–3.1)
MCH: 31.3 pg (ref 26.6–33.0)
MCHC: 35.5 g/dL (ref 31.5–35.7)
MCV: 88 fL (ref 79–97)
MONOS ABS: 0.7 10*3/uL (ref 0.1–0.9)
Monocytes: 12 %
NEUTROS ABS: 1.9 10*3/uL (ref 1.4–7.0)
Neutrophils: 37 %
PLATELETS: 194 10*3/uL (ref 150–379)
RBC: 4.06 x10E6/uL — ABNORMAL LOW (ref 4.14–5.80)
RDW: 12.9 % (ref 12.3–15.4)
WBC: 5.3 10*3/uL (ref 3.4–10.8)

## 2017-03-08 LAB — BASIC METABOLIC PANEL
BUN/Creatinine Ratio: 23 — ABNORMAL HIGH (ref 9–20)
BUN: 20 mg/dL (ref 6–24)
CO2: 23 mmol/L (ref 20–29)
CREATININE: 0.86 mg/dL (ref 0.76–1.27)
Calcium: 9.5 mg/dL (ref 8.7–10.2)
Chloride: 104 mmol/L (ref 96–106)
GFR, EST AFRICAN AMERICAN: 121 mL/min/{1.73_m2} (ref >59)
GFR, EST NON AFRICAN AMERICAN: 105 mL/min/{1.73_m2} (ref >59)
Glucose: 98 mg/dL (ref 65–99)
POTASSIUM: 4.4 mmol/L (ref 3.5–5.2)
SODIUM: 142 mmol/L (ref 134–144)

## 2017-03-09 ENCOUNTER — Telehealth: Payer: Self-pay | Admitting: Cardiology

## 2017-03-09 NOTE — Telephone Encounter (Signed)
He explains that his wife is coming down w/ illness/cold.  Denies she has any fever.  He was asking if this was safe/ok being that he has procedure next week.  Informed pt to monitor, proactively take medication for cold/allergies if needed and call the office on Monday if he actually comes down with something.   Explained we would not cancel procedure unless he was sick w/ certain symptoms.  He will call the office Monday to inform us of any symptoms he may have if he "catches anything". He thanks me for calling

## 2017-03-09 NOTE — Telephone Encounter (Signed)
°  New Prob  Pt has some questions regarding him upcoming ablation. Please call.

## 2017-03-11 ENCOUNTER — Ambulatory Visit (HOSPITAL_COMMUNITY): Payer: No Typology Code available for payment source

## 2017-03-14 ENCOUNTER — Other Ambulatory Visit: Payer: Self-pay

## 2017-03-14 MED ORDER — FLECAINIDE ACETATE 50 MG PO TABS: 50.0000 mg | ORAL_TABLET | Freq: Two times a day (BID) | ORAL | 11 refills | Status: DC

## 2017-03-14 MED ORDER — RIVAROXABAN 20 MG PO TABS: 20.0000 mg | ORAL_TABLET | Freq: Every day | ORAL | 11 refills | Status: DC

## 2017-03-14 MED ORDER — METOPROLOL TARTRATE 25 MG PO TABS
25.0000 mg | ORAL_TABLET | Freq: Two times a day (BID) | ORAL | 11 refills | Status: DC
Start: 2017-03-14 — End: 2017-03-14

## 2017-03-14 MED ORDER — METOPROLOL TARTRATE 25 MG PO TABS: 25.0000 mg | ORAL_TABLET | Freq: Two times a day (BID) | ORAL | 11 refills | Status: DC

## 2017-03-14 NOTE — Addendum Note (Signed)
Addended by: Jacinta Shoe on: 03/14/2017 04:40 PM   Modules accepted: Orders

## 2017-03-14 NOTE — Telephone Encounter (Signed)
Request received for Xarelto 62m; pt is 46yrs old, Wt-80.6kg, Crea-0.86 on 03/07/2017, CrCl-123.664mmin, last seen by Dr. CaCurt Bearsn 03/07/17; will send in refill in request.

## 2017-03-15 ENCOUNTER — Ambulatory Visit (HOSPITAL_COMMUNITY)
Admission: RE | Admit: 2017-03-15 | Discharge: 2017-03-15 | Disposition: A | Discharge status: Home / Self Care | Payer: No Typology Code available for payment source | Source: Ambulatory Visit | Attending: Cardiology | Admitting: Cardiology

## 2017-03-15 DIAGNOSIS — I480 Paroxysmal atrial fibrillation: Secondary | ICD-10-CM | POA: Insufficient documentation

## 2017-03-15 DIAGNOSIS — I4891 Unspecified atrial fibrillation: Secondary | ICD-10-CM | POA: Diagnosis not present

## 2017-03-15 MED ORDER — IOPAMIDOL (ISOVUE-370) INJECTION 76%
INTRAVENOUS | Status: AC
  Administered 2017-03-15: 80 mL via INTRAVENOUS
  Filled 2017-03-15: qty 100

## 2017-03-15 MED ORDER — NITROGLYCERIN 0.4 MG SL SUBL
SUBLINGUAL_TABLET | SUBLINGUAL | Status: AC
  Administered 2017-03-15: 0.4 mg
  Filled 2017-03-15: qty 1

## 2017-03-16 ENCOUNTER — Ambulatory Visit: Payer: No Typology Code available for payment source | Admitting: Cardiology

## 2017-03-17 ENCOUNTER — Encounter (HOSPITAL_COMMUNITY): Payer: Self-pay | Admitting: General Practice

## 2017-03-17 ENCOUNTER — Ambulatory Visit (HOSPITAL_COMMUNITY)
Admission: RE | Admit: 2017-03-17 | Discharge: 2017-03-18 | Disposition: A | Discharge status: Home / Self Care | Payer: No Typology Code available for payment source | Source: Ambulatory Visit | Attending: Cardiology | Admitting: Cardiology

## 2017-03-17 ENCOUNTER — Encounter (HOSPITAL_COMMUNITY)
Admission: RE | Disposition: A | Discharge status: Home / Self Care | Payer: Self-pay | Source: Ambulatory Visit | Attending: Cardiology

## 2017-03-17 ENCOUNTER — Ambulatory Visit (HOSPITAL_COMMUNITY): Payer: No Typology Code available for payment source | Admitting: Certified Registered"

## 2017-03-17 DIAGNOSIS — Z79899 Other long term (current) drug therapy: Secondary | ICD-10-CM | POA: Insufficient documentation

## 2017-03-17 DIAGNOSIS — I483 Typical atrial flutter: Secondary | ICD-10-CM | POA: Insufficient documentation

## 2017-03-17 DIAGNOSIS — I4891 Unspecified atrial fibrillation: Secondary | ICD-10-CM | POA: Diagnosis present

## 2017-03-17 DIAGNOSIS — I10 Essential (primary) hypertension: Secondary | ICD-10-CM | POA: Insufficient documentation

## 2017-03-17 DIAGNOSIS — Z7901 Long term (current) use of anticoagulants: Secondary | ICD-10-CM | POA: Insufficient documentation

## 2017-03-17 DIAGNOSIS — I480 Paroxysmal atrial fibrillation: Secondary | ICD-10-CM | POA: Diagnosis not present

## 2017-03-17 HISTORY — PX: ATRIAL FIBRILLATION ABLATION: EP1191

## 2017-03-17 LAB — POCT ACTIVATED CLOTTING TIME
ACTIVATED CLOTTING TIME: 268 s
ACTIVATED CLOTTING TIME: 313 s
ACTIVATED CLOTTING TIME: 153 s

## 2017-03-17 SURGERY — ATRIAL FIBRILLATION ABLATION
Anesthesia: General

## 2017-03-17 MED ORDER — HEPARIN (PORCINE) IN NACL 2-0.9 UNIT/ML-% IJ SOLN
INTRAMUSCULAR | Status: AC | PRN
  Administered 2017-03-17: 2000 mL

## 2017-03-17 MED ORDER — HEPARIN (PORCINE) IN NACL 2-0.9 UNIT/ML-% IJ SOLN
INTRAMUSCULAR | Status: AC
  Filled 2017-03-17: qty 500

## 2017-03-17 MED ORDER — SODIUM CHLORIDE 0.9 % IV SOLN: 250.0000 mL | INTRAVENOUS | Status: DC | PRN

## 2017-03-17 MED ORDER — SODIUM CHLORIDE 0.9 % IV SOLN
INTRAVENOUS | Status: DC | PRN
  Administered 2017-03-17 (×2): via INTRAVENOUS

## 2017-03-17 MED ORDER — BUPIVACAINE HCL (PF) 0.25 % IJ SOLN
INTRAMUSCULAR | Status: AC
  Filled 2017-03-17: qty 30

## 2017-03-17 MED ORDER — PROTAMINE SULFATE 10 MG/ML IV SOLN
INTRAVENOUS | Status: DC | PRN
  Administered 2017-03-17 (×4): 10 mg via INTRAVENOUS

## 2017-03-17 MED ORDER — BUPIVACAINE HCL (PF) 0.25 % IJ SOLN
INTRAMUSCULAR | Status: DC | PRN
  Administered 2017-03-17: 30 mL

## 2017-03-17 MED ORDER — RIVAROXABAN 20 MG PO TABS
20.0000 mg | ORAL_TABLET | Freq: Every day | ORAL | Status: DC
  Administered 2017-03-17: 20 mg via ORAL
  Filled 2017-03-17: qty 1

## 2017-03-17 MED ORDER — DOBUTAMINE IN D5W 4-5 MG/ML-% IV SOLN
INTRAVENOUS | Status: DC | PRN
  Administered 2017-03-17: 20 ug/kg/min via INTRAVENOUS

## 2017-03-17 MED ORDER — SODIUM CHLORIDE 0.9% FLUSH
3.0000 mL | INTRAVENOUS | Status: DC | PRN
Start: 2017-03-17 — End: 2017-03-18

## 2017-03-17 MED ORDER — ACETAMINOPHEN 325 MG PO TABS
650.0000 mg | ORAL_TABLET | ORAL | Status: DC | PRN
  Administered 2017-03-17: 650 mg via ORAL
  Filled 2017-03-17: qty 2

## 2017-03-17 MED ORDER — ROCURONIUM BROMIDE 100 MG/10ML IV SOLN
INTRAVENOUS | Status: DC | PRN
  Administered 2017-03-17: 30 mg via INTRAVENOUS
  Administered 2017-03-17: 50 mg via INTRAVENOUS
  Administered 2017-03-17: 20 mg via INTRAVENOUS

## 2017-03-17 MED ORDER — DOBUTAMINE IN D5W 4-5 MG/ML-% IV SOLN
INTRAVENOUS | Status: AC
  Filled 2017-03-17: qty 250

## 2017-03-17 MED ORDER — PROPOFOL 10 MG/ML IV BOLUS
INTRAVENOUS | Status: DC | PRN
  Administered 2017-03-17: 200 mg via INTRAVENOUS

## 2017-03-17 MED ORDER — ONDANSETRON HCL 4 MG/2ML IJ SOLN
INTRAMUSCULAR | Status: DC | PRN
  Administered 2017-03-17: 4 mg via INTRAVENOUS

## 2017-03-17 MED ORDER — HEPARIN SODIUM (PORCINE) 1000 UNIT/ML IJ SOLN
INTRAMUSCULAR | Status: AC
  Filled 2017-03-17: qty 1

## 2017-03-17 MED ORDER — HEPARIN SODIUM (PORCINE) 1000 UNIT/ML IJ SOLN
INTRAMUSCULAR | Status: DC | PRN
  Administered 2017-03-17: 15000 [IU] via INTRAVENOUS
  Administered 2017-03-17: 2000 [IU] via INTRAVENOUS
  Administered 2017-03-17: 6000 [IU] via INTRAVENOUS

## 2017-03-17 MED ORDER — SODIUM CHLORIDE 0.9% FLUSH
3.0000 mL | Freq: Two times a day (BID) | INTRAVENOUS | Status: DC
  Administered 2017-03-17: 3 mL via INTRAVENOUS

## 2017-03-17 MED ORDER — SUGAMMADEX SODIUM 200 MG/2ML IV SOLN
INTRAVENOUS | Status: DC | PRN
  Administered 2017-03-17: 200 mg via INTRAVENOUS

## 2017-03-17 MED ORDER — FENTANYL CITRATE (PF) 100 MCG/2ML IJ SOLN
INTRAMUSCULAR | Status: DC | PRN
  Administered 2017-03-17: 100 ug via INTRAVENOUS
  Administered 2017-03-17 (×2): 50 ug via INTRAVENOUS

## 2017-03-17 MED ORDER — LIDOCAINE HCL (CARDIAC) 20 MG/ML IV SOLN
INTRAVENOUS | Status: DC | PRN
  Administered 2017-03-17: 100 mg via INTRAVENOUS

## 2017-03-17 MED ORDER — DEXTROSE 5 % IV SOLN
INTRAVENOUS | Status: DC | PRN
  Administered 2017-03-17: 15 ug/min via INTRAVENOUS

## 2017-03-17 MED ORDER — MIDAZOLAM HCL 5 MG/5ML IJ SOLN
INTRAMUSCULAR | Status: DC | PRN
  Administered 2017-03-17: 2 mg via INTRAVENOUS

## 2017-03-17 MED ORDER — METOPROLOL TARTRATE 25 MG PO TABS
25.0000 mg | ORAL_TABLET | Freq: Two times a day (BID) | ORAL | Status: DC
  Administered 2017-03-17 – 2017-03-18 (×2): 25 mg via ORAL
  Filled 2017-03-17 (×2): qty 1

## 2017-03-17 MED ORDER — ADULT MULTIVITAMIN W/MINERALS CH
1.0000 | ORAL_TABLET | Freq: Every day | ORAL | Status: DC
  Administered 2017-03-18: 1 via ORAL
  Filled 2017-03-17: qty 1

## 2017-03-17 MED ORDER — SODIUM CHLORIDE 0.9 % IV BOLUS (SEPSIS)
500.0000 mL | Freq: Once | INTRAVENOUS | Status: AC
  Administered 2017-03-17: 500 mL via INTRAVENOUS

## 2017-03-17 MED ORDER — ONDANSETRON HCL 4 MG/2ML IJ SOLN: 4.0000 mg | Freq: Four times a day (QID) | INTRAMUSCULAR | Status: DC | PRN

## 2017-03-17 MED ORDER — LOSARTAN POTASSIUM 50 MG PO TABS
75.0000 mg | ORAL_TABLET | Freq: Every day | ORAL | Status: DC
  Administered 2017-03-17 – 2017-03-18 (×2): 75 mg via ORAL
  Filled 2017-03-17 (×2): qty 1

## 2017-03-17 MED ORDER — HEPARIN SODIUM (PORCINE) 1000 UNIT/ML IJ SOLN
INTRAMUSCULAR | Status: DC | PRN
  Administered 2017-03-17: 1000 [IU] via INTRAVENOUS

## 2017-03-17 MED ORDER — FENTANYL CITRATE (PF) 100 MCG/2ML IJ SOLN: 25.0000 ug | INTRAMUSCULAR | Status: DC | PRN

## 2017-03-17 MED ORDER — NIACIN ER 500 MG PO CPCR
1500.0000 mg | ORAL_CAPSULE | Freq: Every day | ORAL | Status: DC
  Filled 2017-03-17: qty 3

## 2017-03-17 MED ORDER — FLECAINIDE ACETATE 50 MG PO TABS
50.0000 mg | ORAL_TABLET | Freq: Two times a day (BID) | ORAL | Status: DC
  Administered 2017-03-17 – 2017-03-18 (×2): 50 mg via ORAL
  Filled 2017-03-17 (×2): qty 1

## 2017-03-17 SURGICAL SUPPLY — 17 items
BLANKET WARM UNDERBOD FULL ACC (MISCELLANEOUS) ×2 IMPLANT
CATH SMTCH THERMOCOOL SF DF (CATHETERS) ×2 IMPLANT
CATH SOUNDSTAR 3D IMAGING (CATHETERS) ×2 IMPLANT
CATH VARIABLE LASSO NAV 2515 (CATHETERS) ×2 IMPLANT
CATH WEBSTER BI DIR CS D-F CRV (CATHETERS) ×2 IMPLANT
COVER SWIFTLINK CONNECTOR (BAG) ×2 IMPLANT
NEEDLE TRANSSEPTAL BRK 98CM (NEEDLE) ×2 IMPLANT
PACK EP LATEX FREE (CUSTOM PROCEDURE TRAY) ×1
PACK EP LF (CUSTOM PROCEDURE TRAY) ×1 IMPLANT
PAD DEFIB LIFELINK (PAD) ×2 IMPLANT
PATCH CARTO3 (PAD) ×2 IMPLANT
SHEATH AGILIS NXT 8.5F 71CM (SHEATH) ×4 IMPLANT
SHEATH AVANTI 11F 11CM (SHEATH) ×2 IMPLANT
SHEATH PINNACLE 7F 10CM (SHEATH) ×2 IMPLANT
SHEATH PINNACLE 8F 10CM (SHEATH) ×4 IMPLANT
SHEATH PINNACLE 9F 10CM (SHEATH) ×4 IMPLANT
TUBING SMART ABLATE COOLFLOW (TUBING) ×2 IMPLANT

## 2017-03-17 NOTE — H&P (Signed)
Johnathan Walker is a 46 y.o. male with a history of atrial fibrillation. He had been on flecainide with rapid atrial flutter on higher doses. He thus presents for ablation of atrial fibrillation. He has been compliant with his Eliquis. On exam, RRR, no murmurs, lungs clear. Risks and benefits discussed. Risks include but not limited to bleeding and infection. He understands the risks and has agreed to the procedure.  Cecille Mcclusky Curt Bears, MD 03/17/2017 10:34 AM

## 2017-03-17 NOTE — Progress Notes (Signed)
Verified with Dr. Raiford Simmonds that bed rest will continue for the next 2-3 hours. Bleeding has stop and dsg has been changed. BP stable. Area soft, non-tender, no evidence of hematoma. Will continue to monitor closely.  Jacqlyn Larsen, RN

## 2017-03-17 NOTE — Progress Notes (Signed)
Site area: lt groin fv sheaths pulled and pressure held by [facility] Site Prior to Removal:  Level  0 Pressure Applied For: 20 minutes Manual:   yes Patient Status During Pull:  stable Post Pull Site:  Level  0 Post Pull Instructions Given:  yes Post Pull Pulses Present: palpable Dressing Applied:  Gauze and tegaderm Bedrest begins @ 1420 Comments:

## 2017-03-17 NOTE — Anesthesia Procedure Notes (Signed)
Procedure Name: Intubation Date/Time: 03/17/2017 11:23 AM Performed by: Gaylene Brooks Pre-anesthesia Checklist: Patient identified, Emergency Drugs available, Suction available and Patient being monitored Patient Re-evaluated:Patient Re-evaluated prior to induction Oxygen Delivery Method: Circle System Utilized Preoxygenation: Pre-oxygenation with 100% oxygen Induction Type: IV induction Ventilation: Mask ventilation without difficulty Laryngoscope Size: Mac and 4 Grade View: Grade III Tube type: Oral Tube size: 7.5 mm Number of attempts: 2 Airway Equipment and Method: Stylet and Oral airway Placement Confirmation: ETT inserted through vocal cords under direct vision,  positive ETCO2 and breath sounds checked- equal and bilateral Secured at: 22 cm Tube secured with: Tape Dental Injury: Teeth and Oropharynx as per pre-operative assessment  Difficulty Due To: Difficult Airway- due to anterior larynx Comments: Easy mask. DL with Sabra Heck 2, unable to see cords due to anterior larynx and large epiglottis. DL with MAC 4, Grade 3 view, able to pass ETT through cords. AOI. +ETCO2, BBS=.

## 2017-03-17 NOTE — Anesthesia Postprocedure Evaluation (Signed)
Anesthesia Post Note  Patient: Johnathan Walker  Procedure(s) Performed: Atrial Fibrillation Ablation (N/A )     Patient location during evaluation: PACU Anesthesia Type: General Level of consciousness: awake Pain management: pain level controlled Respiratory status: spontaneous breathing Cardiovascular status: stable Anesthetic complications: no    Last Vitals:  Vitals:   03/17/17 1529 03/17/17 1530  BP:  123/60  Pulse:  88  Resp:  14  Temp: 36.9 C   SpO2:  99%    Last Pain:  Vitals:   03/17/17 1529  TempSrc: Temporal                 Keierra Nudo

## 2017-03-17 NOTE — Progress Notes (Signed)
Pt attempted to ambulate post bed rest. When standing pt's right femoral site began to bleed. Pt loss approx 249m or less of blood. BP dropped from 117/64 to 71/39. Pt c/o lightheadedness but otherwise alert. Dr. FRaiford Simmondswith cardiology notified; verbal order received for 500cc bolus. Pt BP has since recovered; see flowsheet. Pt now asymptomatic. Will continue bed rest and monitoring closely.  MJacqlyn Larsen RN

## 2017-03-17 NOTE — Significant Event (Addendum)
Rapid Response Event Note  Overview: Time Called: 2139 Arrival Time: 2145 Event Type: Hypotension, Other (Comment)  Initial Focused Assessment: Called by Charge RN to assess patient for bleeding from RT Groin/Femoral Site and hypotension.  Patient is s/p ablation today, both femoral veins were accessed.  When I arrived 2145, RN was holding pressure, I switched places with the RN and I held 20 minutes of manual pressure to the RT femoral site.  Upon my arrival, SBP was in the mid 90s, patient stated he was staring to feel a little better, face was pale and patient was shivering.  NS bolus was started, SBP improved into the 110-120s, patient's color improved, shivering stopped, and after 20 minutes of steady pressure, bleeding had completely stopped, level 0, no hematoma, site is soft.  Compared to Left Groin, both level 0 and no hematoma now. New pressure dressing applied to patient's RT femoral site.  + 2 pulses DP and PT bilaterally throughout the event. HR remained in the 90s SR.    Interventions: -- 500cc NS bolus -- 20 minutes of manual pressure -- new pressure dressing applied  Plan of Care (if not transferred): -- 2-3 hours of bedrest -- Asked RNs to notify RR RN when patient is ready to try to get up, I will try to come and assist them if needed -- Call RR RN if needed   Event Summary: Name of Physician Notified: Cards Fellow MD by Charge RN at 2145    at    Outcome: Stayed in room and stabalized  Event End Time: [place], [place]

## 2017-03-17 NOTE — Anesthesia Preprocedure Evaluation (Signed)
Anesthesia Evaluation  Patient identified by MRN, date of birth, ID band Patient awake    Reviewed: Allergy & Precautions, NPO status , Patient's Chart, lab work & pertinent test results  Airway Mallampati: II  TM Distance: >3 FB     Dental   Pulmonary neg pulmonary ROS,    breath sounds clear to auscultation       Cardiovascular hypertension, negative cardio ROS  + dysrhythmias Atrial Fibrillation  Rhythm:Regular Rate:Normal     Neuro/Psych negative neurological ROS     GI/Hepatic negative GI ROS, Neg liver ROS,   Endo/Other  negative endocrine ROS  Renal/GU negative Renal ROS     Musculoskeletal   Abdominal   Peds  Hematology negative hematology ROS (+)   Anesthesia Other Findings   Reproductive/Obstetrics                             Anesthesia Physical Anesthesia Plan  ASA: III  Anesthesia Plan: General   Post-op Pain Management:    Induction: Intravenous  PONV Risk Score and Plan: 2 and Ondansetron and Dexamethasone  Airway Management Planned: Oral ETT  Additional Equipment:   Intra-op Plan:   Post-operative Plan: Extubation in OR  Informed Consent:   Dental advisory given  Plan Discussed with: CRNA and Anesthesiologist  Anesthesia Plan Comments:         Anesthesia Quick Evaluation

## 2017-03-17 NOTE — Transfer of Care (Signed)
Immediate Anesthesia Transfer of Care Note  Patient: Newman Waren  Procedure(s) Performed: Atrial Fibrillation Ablation (N/A )  Patient Location: PACU  Anesthesia Type:General  Level of Consciousness: Awake. Oriented. Drowsy.   Airway & Oxygen Therapy: Patient Spontanous Breathing  Post-op Assessment: Report given to RN and Post -op Vital signs reviewed and stable  Post vital signs: Reviewed and stable  Last Vitals:  Vitals:   03/17/17 1447 03/17/17 1450  BP:  (!) 117/49  Pulse:  89  Resp:  10  Temp: 36.6 C   SpO2:  97%    Last Pain:  Vitals:   03/17/17 1447  TempSrc: Temporal         Complications: No apparent anesthesia complications

## 2017-03-17 NOTE — Discharge Summary (Signed)
ELECTROPHYSIOLOGY PROCEDURE DISCHARGE SUMMARY    Patient ID: Johnathan Walker,  MRN: [id number], DOB/AGE: 45-Dec-1972 45 y.o.  Admit date: 03/17/2017 Discharge date: 03/18/17  Primary Care Physician: Hulan Fess, MD  Primary Cardiologist: Dr. Acie Fredrickson Electrophysiologist: Dr. Curt Bears  Primary Discharge Diagnosis:  1. Paroxysmal AFib 2. Atypical AFlutter CHA2DS2Vasc is at least one, on xarelto  Secondary Discharge Diagnosis:  1. HTN  Procedures This Admission:  1.  Electrophysiology study and radiofrequency catheter ablation on 10/111/18 by Dr Curt Bears.   This study demonstrated    CONCLUSIONS: 1. Sinus rhythm upon presentation.   2. Successful electrical isolation and anatomical encircling of all four pulmonary veins with radiofrequency current.    3. Cavo-tricuspid isthmus ablation was performed with complete bidirectional isthmus block achieved.  4. No inducible arrhythmias following ablation both on and off of dobutamine 5. No early apparent complications  Brief HPI: Johnathan Walker is a 46 y.o. male with a history of paroxysmal atrial fibrillation, atypical aflutter. He has failed medical therapy with felcainide. Risks, benefits, and alternatives to catheter ablation of atrial fibrillation were reviewed with the patient who wished to proceed.  The patient underwent cardiac CT prior to the procedure which demonstrated no LAA thrombus.    Hospital Course:  The patient was admitted and underwent EPS/RFCA of atrial fibrillation with details as outlined above.  The patient was monitored on telemetry overnight which demonstrated SR.  Last night when OOB his R groin bleed and required pressure to be held.  He has ambulated to the BR this AM without difficulty or bleeding.  This morning b/l groins are without complication on the day of discharge.  The patient was examined by Dr. Curt Bears and considered to be stable for discharge.  Wound care and activity restrictions were reviewed with  the patient.  The patient Johnathan Walker be seen back by Roderic Palau, NP in 4 weeks and Dr Curt Bears in 12 weeks for post ablation follow up.     Physical Exam: Vitals:   03/17/17 2147 03/17/17 2152 03/17/17 2157 03/18/17 0500  BP: (!) 96/56 (!) 110/55 (!) 120/54 115/67  Pulse:  88 90 77  Resp:    14  Temp:    98.6 F (37 C)  TempSrc:    Oral  SpO2:  98% 98% 100%  Weight:    174 lb 1.6 oz (79 kg)  Height:        GEN- The patient is well appearing, alert and oriented x 3 today.   HEENT: normocephalic, atraumatic; sclera clear, conjunctiva pink; hearing intact; oropharynx clear; neck supple  Lungs- CTA b/l, normal work of breathing.  No wheezes, rales, rhonchi Heart- RRR, no murmurs, rubs or gallops  GI- soft, non-tender, non-distended Extremities- no clubbing, cyanosis, or edema; DP/PT/radial pulses 2+ bilaterally, b/l groin without hematoma/bruit MS- no significant deformity or atrophy Skin- warm and dry, no rash or lesion Psych- euthymic mood, full affect Neuro- strength and sensation are intact   Labs:   Lab Results  Component Value Date   WBC 5.3 03/07/2017   HGB 12.7 (L) 03/07/2017   HCT 35.8 (L) 03/07/2017   MCV 88 03/07/2017   PLT 194 03/07/2017   No results for input(s): NA, K, CL, CO2, BUN, CREATININE, CALCIUM, PROT, BILITOT, ALKPHOS, ALT, AST, GLUCOSE in the last 168 hours.  Invalid input(s): LABALBU   Discharge Medications:  Allergies as of 03/18/2017   No Known Allergies     Medication List    TAKE these medications   flecainide 50  MG tablet Commonly known as:  TAMBOCOR Take 1 tablet (50 mg total) by mouth 2 (two) times daily.   losartan 50 MG tablet Commonly known as:  COZAAR Take 75 mg by mouth daily.   metoprolol tartrate 25 MG tablet Commonly known as:  LOPRESSOR Take 1 tablet (25 mg total) by mouth 2 (two) times daily.   multivitamin tablet Take 1 tablet by mouth daily.   niacin 500 MG tablet Commonly known as:  SLO-NIACIN Take 1,500 mg by  mouth at bedtime.   rivaroxaban 20 MG Tabs tablet Commonly known as:  XARELTO Take 1 tablet (20 mg total) by mouth daily with supper.   TART CHERRY ADVANCED PO Take 2 capsules by mouth daily.       Disposition:  Home  Discharge Instructions    Diet - low sodium heart healthy    Complete by:  As directed    Increase activity slowly    Complete by:  As directed      Follow-up Information    [facility] Follow up on 04/18/2017.   Specialty:  Cardiology Why:  3:00PM Contact information: [address] [id number] [facility] [address] [place] [address] [phone]       Constance Haw, MD Follow up on 06/20/2017.   Specialty:  Cardiology Why:  3:30PM Contact information: [place] [address] [phone]           Duration of Discharge Encounter: Greater than 30 minutes including physician time.  Signed, Tommye Standard, PA-C 03/18/2017 9:05 AM  I have seen and examined this patient with Tommye Standard.  Agree with above, note added to reflect my findings.  On exam, RRR, no murmurs, lungs clear.  Patient had AF ablation for paroxysmal atrial fibrillation. Tolerated the procedure well without complaint this morning. Plan for discharge today with follow up in EP clinic.  Perry Molla M. Jayveion Stalling MD 03/18/2017 10:56 AM

## 2017-03-17 NOTE — Progress Notes (Signed)
Site area: rt groin fv sheaths x2 Site Prior to Removal:  Level 0 Pressure Applied For:  20 minutes Manual:   yes Patient Status During Pull:  stable Post Pull Site:  Level  0 Post Pull Instructions Given:  yes Post Pull Pulses Present: palpable Dressing Applied:  Gauze and tegaderm Bedrest begins @ 1520 Comments:  IV saline locked

## 2017-03-17 NOTE — Accreditation Note (Signed)
Got to patient's room at 2119 to administer his scheduled PM medications.  Patient stated he was about to call the writer because his bed rest was about to end and he wanted to ambulate to the bathroom.  The writer assessed both groin for bleeding; dressing dry and intact bilaterally.  PM medications administered, but when patient attempted to stand up a large volume of blood poured from his right groin to the floor, saturated the bed pad and gown.  The writer held pressure, notified the charge nurse who called Rapid response nurse. The writer held pressure until RR arrived and took over.  Please see RR note for more details.  Will continue to monitor patient.

## 2017-03-18 ENCOUNTER — Encounter (HOSPITAL_COMMUNITY): Payer: Self-pay | Admitting: Cardiology

## 2017-03-18 DIAGNOSIS — Z7901 Long term (current) use of anticoagulants: Secondary | ICD-10-CM | POA: Diagnosis not present

## 2017-03-18 DIAGNOSIS — I480 Paroxysmal atrial fibrillation: Secondary | ICD-10-CM | POA: Diagnosis not present

## 2017-03-18 DIAGNOSIS — I483 Typical atrial flutter: Secondary | ICD-10-CM | POA: Diagnosis not present

## 2017-03-18 DIAGNOSIS — I10 Essential (primary) hypertension: Secondary | ICD-10-CM | POA: Diagnosis not present

## 2017-03-18 NOTE — Progress Notes (Signed)
Pt. Ambulated in hallway and tolerated well. R femoral site assessed and a level 0. IV removed by NT. Discharge instructions given to patient and questions answered.

## 2017-04-18 ENCOUNTER — Ambulatory Visit (HOSPITAL_COMMUNITY)
Admission: RE | Admit: 2017-04-18 | Discharge: 2017-04-18 | Disposition: A | Discharge status: Home / Self Care | Payer: No Typology Code available for payment source | Source: Ambulatory Visit | Attending: Nurse Practitioner | Admitting: Nurse Practitioner

## 2017-04-18 ENCOUNTER — Encounter (HOSPITAL_COMMUNITY): Payer: Self-pay | Admitting: Nurse Practitioner

## 2017-04-18 VITALS — BP 132/74 | HR 67 | Ht 74.0 in | Wt 177.6 lb

## 2017-04-18 DIAGNOSIS — Z79899 Other long term (current) drug therapy: Secondary | ICD-10-CM | POA: Insufficient documentation

## 2017-04-18 DIAGNOSIS — Z7901 Long term (current) use of anticoagulants: Secondary | ICD-10-CM | POA: Insufficient documentation

## 2017-04-18 DIAGNOSIS — I10 Essential (primary) hypertension: Secondary | ICD-10-CM | POA: Insufficient documentation

## 2017-04-18 DIAGNOSIS — I480 Paroxysmal atrial fibrillation: Secondary | ICD-10-CM | POA: Diagnosis not present

## 2017-04-18 DIAGNOSIS — E7800 Pure hypercholesterolemia, unspecified: Secondary | ICD-10-CM | POA: Insufficient documentation

## 2017-04-18 DIAGNOSIS — I4891 Unspecified atrial fibrillation: Secondary | ICD-10-CM | POA: Diagnosis present

## 2017-04-18 NOTE — Progress Notes (Signed)
Primary Care Physician: Hulan Fess, MD Referring Physician: [facility] triage EP: Dr. Irven Shelling is a 46 y.o. male with a h/o paroxysmal afib in June of this year where he was treated with first onset afib. He was started on metoprolol. It was discussed that he snored with apnea episodes and sleep study was discussed. He was seen f/u with Dr. Curt Bears and his afib burden was low so he was to continue on metoprolol and baby asa with chadsvasc score of 1. If burden did increase flecainide was discussed.  He was seen in the afib clinic 8/23 for increased afib burden  that  was noted more so with his running. He runs 3-5 miles 3x a week. He is having 3-4 episodes a week,late afternoon that would last 1-5 hours. He had not tried any extra metoprolol for breakthrough afib.He is avoiding excessive caffeine, no alcohol, sleep study is still pending.  He was started on 50 mg flecainide and he had an ETT on flecainide with excellent effort without any EKG interval changes. After that he noted some increase in afib and was told by Dr. Curt Bears office to increase to 100 mg bid and was set up for repeat EKG.  This past Friday, he was running and toward the end of his run. His HR was noted to be around 160 bpm. The next thing he was aware of he had passed out and struck his head.Marland Kitchen He had the laceration closed with staples after presenting to the ER and CT of the head did not show any acute intracranial findings. He refused cardioversion and was told to redcue flecainde back to 50 mg bid.  In the afib clinic 9/17,f/u ER visit, he continues in atrial flutter at 85 bpm, tolerating OK. He is not on anticoagualtion. SInce he is out of the 48 hour window, he will have to be started on xarelto before he could be cardioverted, however, the pt would like to see Dr. Carlis Stable to get scheduled for ablation. He is not going to run anymore until he gets afib further evaluated. He saw Dr. Loletha Grayer , who did consult  in the  ER and felt that he may have had 1:1 AV conduction and very rapid v rates leading to syncope.  F/u in afib clinic, s/p ablation 10/11.  He has not noted any afib since procedure. He has returned to doing some short runs and tolerated well. No further syncope. No swallowing or groin issues.  Today, he denies symptoms of palpitations, chest pain, shortness of breath, orthopnea, PND, lower extremity edema, dizziness,  or neurologic sequela. The patient is tolerating medications without difficulties and is otherwise without complaint today.   Past Medical History:  Diagnosis Date  . Atrial fibrillation with rapid ventricular response ([place]) 11/16/2016  . Essential hypertension 11/16/2016  . Hypercholesteremia   . Hypertension   . New onset atrial fibrillation ([place]) 11/15/2016   Past Surgical History:  Procedure Laterality Date  . ATRIAL FIBRILLATION ABLATION  03/17/2017    Current Outpatient Medications  Medication Sig Dispense Refill  . flecainide (TAMBOCOR) 50 MG tablet Take 1 tablet (50 mg total) by mouth 2 (two) times daily. 60 tablet 11  . losartan (COZAAR) 50 MG tablet Take 75 mg by mouth daily.  2  . metoprolol tartrate (LOPRESSOR) 25 MG tablet Take 1 tablet (25 mg total) by mouth 2 (two) times daily. 60 tablet 11  . Misc Natural Products (TART CHERRY ADVANCED PO) Take 2 capsules  by mouth daily.    . Multiple Vitamin (MULTIVITAMIN) tablet Take 1 tablet by mouth daily.    . niacin (SLO-NIACIN) 500 MG tablet Take 1,500 mg by mouth at bedtime.    . rivaroxaban (XARELTO) 20 MG TABS tablet Take 1 tablet (20 mg total) by mouth daily with supper. 30 tablet 11   No current facility-administered medications for this encounter.     No Known Allergies  Social History   Socioeconomic History  . Marital status: Married    Spouse name: Not on file  . Number of children: Not on file  . Years of education: Not on file  . Highest education level: Not on file  Social Needs  .  Financial resource strain: Not on file  . Food insecurity - worry: Not on file  . Food insecurity - inability: Not on file  . Transportation needs - medical: Not on file  . Transportation needs - non-medical: Not on file  Occupational History  . Not on file  Tobacco Use  . Smoking status: Never Smoker  . Smokeless tobacco: Never Used  Substance and Sexual Activity  . Alcohol use: No  . Drug use: No  . Sexual activity: Not on file  Other Topics Concern  . Not on file  Social History Narrative  . Not on file    Family History  Problem Relation Age of Onset  . Atrial fibrillation Mother   . Diabetes Father   . Hypertension Father   . Lung cancer Father     ROS- All systems are reviewed and negative except as per the HPI above  Physical Exam: Vitals:   04/18/17 1504  BP: 132/74  Pulse: 67  Weight: 177 lb 9.6 oz (80.6 kg)  Height: 6' 2"  (1.88 m)   Wt Readings from Last 3 Encounters:  04/18/17 177 lb 9.6 oz (80.6 kg)  03/18/17 174 lb 1.6 oz (79 kg)  03/07/17 177 lb 12.8 oz (80.6 kg)    Labs: Lab Results  Component Value Date   NA 142 03/07/2017   K 4.4 03/07/2017   CL 104 03/07/2017   CO2 23 03/07/2017   GLUCOSE 98 03/07/2017   BUN 20 03/07/2017   CREATININE 0.86 03/07/2017   CALCIUM 9.5 03/07/2017   No results found for: INR No results found for: CHOL, HDL, LDLCALC, TRIG   GEN- The patient is well appearing, alert and oriented x 3 today.   Head- normocephalic, atraumatic Eyes-  Sclera clear, conjunctiva pink Ears- hearing intact Oropharynx- clear Neck- supple, no JVP Lymph- no cervical lymphadenopathy Lungs- Clear to ausculation bilaterally, normal work of breathing Heart- regular rate and rhythm, no murmurs, rubs or gallops, PMI not laterally displaced GI- soft, NT, ND, + BS Extremities- no clubbing, cyanosis, or edema MS- no significant deformity or atrophy Skin- no rash or lesion Psych- euthymic mood, full affect Neuro- strength and sensation  are intact  EKG- NSR at 67 bpm, pr int 160 ms, qrs int 104 ms, qtc 429 ms Echo-- Left ventricle: The cavity size was normal. Systolic function was normal. The estimated ejection fraction was in the range of 55% to 60%. Wall motion was normal; there were no regional wall motion abnormalities. Left ventricular diastolic function parameters were normal. - Atrial septum: No defect or patent foramen ovale was identified.   Assessment and Plan: 1. Paroxysmal afib Maintaining SR s/p ablation Continue Flecainide  50 mg bid  Continue  Metoprolol  25 bid in ER Chadsvasc score of 1 (HTN)  continue xarelto 20 mg daily, no missed doses  F/u with Dr. Curt Bears as scheduled in January  Korby Ratay C. Kawehi Hostetter, [facility] [address] [place], [place] [address] [phone]

## 2017-05-04 ENCOUNTER — Encounter: Payer: Self-pay | Admitting: Cardiology

## 2017-06-20 ENCOUNTER — Ambulatory Visit: Payer: No Typology Code available for payment source | Admitting: Cardiology

## 2017-07-01 ENCOUNTER — Ambulatory Visit: Payer: No Typology Code available for payment source | Admitting: Cardiology

## 2017-07-05 ENCOUNTER — Ambulatory Visit: Payer: BLUE CROSS/BLUE SHIELD | Admitting: Cardiology

## 2017-07-05 ENCOUNTER — Ambulatory Visit: Payer: No Typology Code available for payment source | Admitting: Cardiology

## 2017-07-05 ENCOUNTER — Encounter: Payer: Self-pay | Admitting: Cardiology

## 2017-07-05 VITALS — BP 118/64 | HR 62 | Ht 74.0 in | Wt 180.0 lb

## 2017-07-05 DIAGNOSIS — I480 Paroxysmal atrial fibrillation: Secondary | ICD-10-CM

## 2017-07-05 DIAGNOSIS — I10 Essential (primary) hypertension: Secondary | ICD-10-CM

## 2017-07-05 NOTE — Progress Notes (Signed)
Electrophysiology Office Note   Date:  07/05/2017   ID:  Jerrie Gullo, DOB 10-Apr-1971, MRN [id number]  PCP:  Hulan Fess, MD  Cardiologist: [facility] Primary Electrophysiologist:  Naif Alabi Meredith Leeds, MD    Chief Complaint  Patient presents with  . Follow-up    3 months post ablation/PAF     History of Present Illness: Johnathan Walker is a 47 y.o. male who is being seen today for the evaluation of atrial fibrillation at the request of Hulan Fess, MD. Presenting today for electrophysiology evaluation. He was hospitalized June 11th 2018 with new onset atrial fibrillation and rapid ventricular response. He was put on metoprolol. Echo at the time showed a normal ejection fraction of 55-60%. After being seen, he continued to have episodes of palpitations. He was put on 50 mg flecainide. He presented to the hospital on 9/14 after an episode of syncope. EKG showed atypical atrial flutter with rates of 240-250 and variable AV block. The patient declined cardioversion in the emergency room. He was started on Xarelto.  Today, denies symptoms of palpitations, chest pain, shortness of breath, orthopnea, PND, lower extremity edema, claudication, dizziness, presyncope, syncope, bleeding, or neurologic sequela. The patient is tolerating medications without difficulties.  He has felt well since ablation.  He has not noted any further episodes of atrial fibrillation.   Past Medical History:  Diagnosis Date  . Atrial fibrillation with rapid ventricular response ([place]) 11/16/2016  . Essential hypertension 11/16/2016  . Hypercholesteremia   . Hypertension   . New onset atrial fibrillation ([place]) 11/15/2016   Past Surgical History:  Procedure Laterality Date  . ATRIAL FIBRILLATION ABLATION  03/17/2017  . ATRIAL FIBRILLATION ABLATION N/A 03/17/2017   Procedure: Atrial Fibrillation Ablation;  Surgeon: Constance Haw, MD;  Location: [facility];  Service: Cardiovascular;  Laterality: N/A;      Current Outpatient Medications  Medication Sig Dispense Refill  . flecainide (TAMBOCOR) 50 MG tablet Take 1 tablet (50 mg total) by mouth 2 (two) times daily. 60 tablet 11  . losartan (COZAAR) 50 MG tablet Take 50 mg by mouth daily.   2  . metoprolol tartrate (LOPRESSOR) 25 MG tablet Take 1 tablet (25 mg total) by mouth 2 (two) times daily. 60 tablet 11  . niacin (SLO-NIACIN) 500 MG tablet Take 1,500 mg by mouth at bedtime.    . rivaroxaban (XARELTO) 20 MG TABS tablet Take 1 tablet (20 mg total) by mouth daily with supper. 30 tablet 11   No current facility-administered medications for this visit.     Allergies:   Patient has no known allergies.   Social History:  The patient  reports that  has never smoked. he has never used smokeless tobacco. He reports that he does not drink alcohol or use drugs.   Family History:  The patient's family history includes Atrial fibrillation in his mother; Diabetes in his father; Hypertension in his father; Lung cancer in his father.    ROS:  Please see the history of present illness.   Otherwise, review of systems is positive for none.   All other systems are reviewed and negative.   PHYSICAL EXAM: VS:  BP 118/64   Pulse 62   Ht 6' 2"  (1.88 m)   Wt 180 lb (81.6 kg)   BMI 23.11 kg/m  , BMI Body mass index is 23.11 kg/m. GEN: Well nourished, well developed, in no acute distress  HEENT: normal  Neck: no JVD, carotid bruits, or masses Cardiac: RRR; no murmurs,  rubs, or gallops,no edema  Respiratory:  clear to auscultation bilaterally, normal work of breathing GI: soft, nontender, nondistended, + BS MS: no deformity or atrophy  Skin: warm and dry Neuro:  Strength and sensation are intact Psych: euthymic mood, full affect  EKG:  EKG is ordered today. Personal review of the ekg ordered shows SR, rate 62, LAD, rate 62   Recent Labs: 11/16/2016: TSH 3.039 03/07/2017: BUN 20; Creatinine, Ser 0.86; Hemoglobin 12.7; Platelets 194; Potassium  4.4; Sodium 142    Lipid Panel  No results found for: CHOL, TRIG, HDL, CHOLHDL, VLDL, LDLCALC, LDLDIRECT   Wt Readings from Last 3 Encounters:  07/05/17 180 lb (81.6 kg)  04/18/17 177 lb 9.6 oz (80.6 kg)  03/18/17 174 lb 1.6 oz (79 kg)      Other studies Reviewed: Additional studies/ records that were reviewed today include: TTE 11/16/16, outside records - personally reviewed  Review of the above records today demonstrates:  - Left ventricle: The cavity size was normal. Systolic function was   normal. The estimated ejection fraction was in the range of 55%   to 60%. Wall motion was normal; there were no regional wall   motion abnormalities. Left ventricular diastolic function   parameters were normal. - Atrial septum: No defect or patent foramen ovale was identified.   ASSESSMENT AND PLAN:  1.  Paroxysmal atrial fibrillation: Currently on Xarelto, flecainide, and metoprolol.  Had an atrial fibrillation ablation 03/17/17.  He has been in normal rhythm since ablation.  Due to that, we Johnathan Walker stop his flecainide, metoprolol, and Xarelto.  His risk of stroke is low.  This patients CHA2DS2-VASc Score and unadjusted Ischemic Stroke Rate (% per year) is equal to 0.6 % stroke rate/year from a score of 1  Above score calculated as 1 point each if present [CHF, HTN, DM, Vascular=MI/PAD/Aortic Plaque, Age if 65-74, or Male] Above score calculated as 2 points each if present [Age > 75, or Stroke/TIA/TE]    2. Hypertension: Well-controlled today.  No changes    Current medicines are reviewed at length with the patient today.   The patient does not have concerns regarding his medicines.  The following changes were made today: Stop Xarelto, flecainide, metoprolol  Labs/ tests ordered today include:  Orders Placed This Encounter  Procedures  . EKG 12-Lead     Disposition:   FU with Johnathan Walker 3  months  Signed, Johnathan Saefong Meredith Leeds, MD  07/05/2017 4:24 PM     [place] [address] [phone] (office) [phone] (fax)

## 2017-07-05 NOTE — Patient Instructions (Signed)
Medication Instructions: Stop Flecainide, Metoprolol, Xarelto   Labwork: None ordered  Procedures/Testing: None ordered  Follow-Up: Your physician recommends that you schedule a follow-up appointment in: 3 months with Dr. Curt Bears.   Any Additional Special Instructions Will Be Listed Below (If Applicable).     If you need a refill on your cardiac medications before your next appointment, please call your pharmacy.

## 2017-07-12 ENCOUNTER — Telehealth: Payer: Self-pay | Admitting: Cardiology

## 2017-07-12 NOTE — Telephone Encounter (Signed)
New message    Patient calling to discuss elevated HR. Please call  STAT if HR is under 50 or over 120 (normal HR is 60-100 beats per minute)  1) What is your heart rate? 90  2) Do you have a log of your heart rate readings (document readings)? 90-->130  3) Do you have any other symptoms? headache

## 2017-07-12 NOTE — Telephone Encounter (Signed)
Patient stated he completely stop taking his metoprolol this past Friday. He states his HR had been 50-60s over the weekend. Yesterday and today his HR has been 80-120 with walking around the office. He also states he feels shaky. He drinks about 1 cup of coffee a day. Advised the patient to hold off on the caffeine and see if symptoms improve. Patient would like to know if symptoms are expected since he has stopped meds. Informed patient I would send to Dr. Curt Bears for further recommendations. Patient verbalized understanding and thanked me for the call.

## 2017-07-14 NOTE — Telephone Encounter (Signed)
F/U call:  Patient returning your call

## 2017-07-14 NOTE — Telephone Encounter (Signed)
Pt reports that he is not in AFib nor has he experienced any AFib since stopping medications. (pt reports he knows when he is experiencing Afib or not)  He reports palpitations yesterday that "last no more than 3 seconds at the most," and only occurred "3/4 times".  He went to gym yesterday, did some running and "seemed ok".  He added that yesterday was a "stressful day" at work. Explains that HRs avg mid 80s, even while sitting, but they "seem to be inching their way back down".  HRs are "jumping to 120s" occasionally. Pt is going to continue to monitor HRs and call if they remain above 110-120s.  He understands why HRs may be higher than he was used to (used to avg 60s when on Metoprolol) after stopping meds.  Pt going to call office back if issues continue/worsen/change in nature.  We will possibly address holter monitoring to determine rhythm if HRs continue to remain elevated. Patient verbalized understanding and agreeable to plan.

## 2017-07-15 ENCOUNTER — Encounter: Payer: Self-pay | Admitting: Cardiology

## 2017-07-20 ENCOUNTER — Telehealth: Payer: Self-pay | Admitting: Cardiology

## 2017-07-20 NOTE — Telephone Encounter (Signed)
Patient complaining of SOB and being in A. Fib with a HR in the 120's. Patient stated he took Flecainide 50 mg and his HR has come down to 90. Patient stated he still feels like he is in A. FIB and would like to see someone tomorrow. A. Fib clinic had some openings, so scheduled patient with Roderic Palau NP tomorrow. Patient stated he thinks he is fine for now, but he knows he is in A. FIB or A. Flutter. Will forward to Dr. Curt Bears and Roderic Palau NP so they are aware.

## 2017-07-20 NOTE — Telephone Encounter (Signed)
Patient c/o Palpitations:  High priority if patient c/o lightheadedness, shortness of breath, or chest pain  1) How long have you had palpitations/irregular HR/ Afib? Are you having the symptoms now? 07/20/2017 hour ago from call 2:55pm, yes   Are you currently experiencing lightheadedness, SOB or CP? SOB 2) Do you have a history of afib (atrial fibrillation) or irregular heart rhythm? Yes   3) Have you checked your BP or HR? (document readings if available): hr 120  Are you experiencing any other symptoms? his monitor states unclassified

## 2017-07-21 ENCOUNTER — Ambulatory Visit (HOSPITAL_COMMUNITY)
Admission: RE | Admit: 2017-07-21 | Discharge: 2017-07-21 | Disposition: A | Discharge status: Home / Self Care | Payer: BLUE CROSS/BLUE SHIELD | Source: Ambulatory Visit | Attending: Nurse Practitioner | Admitting: Nurse Practitioner

## 2017-07-21 ENCOUNTER — Encounter (HOSPITAL_COMMUNITY): Payer: Self-pay | Admitting: Nurse Practitioner

## 2017-07-21 VITALS — BP 127/72 | HR 71 | Ht 72.0 in | Wt 177.8 lb

## 2017-07-21 DIAGNOSIS — Z79899 Other long term (current) drug therapy: Secondary | ICD-10-CM | POA: Diagnosis not present

## 2017-07-21 DIAGNOSIS — I10 Essential (primary) hypertension: Secondary | ICD-10-CM | POA: Diagnosis not present

## 2017-07-21 DIAGNOSIS — I480 Paroxysmal atrial fibrillation: Secondary | ICD-10-CM | POA: Diagnosis not present

## 2017-07-21 DIAGNOSIS — E7800 Pure hypercholesterolemia, unspecified: Secondary | ICD-10-CM | POA: Insufficient documentation

## 2017-07-21 DIAGNOSIS — Z125 Encounter for screening for malignant neoplasm of prostate: Secondary | ICD-10-CM | POA: Diagnosis not present

## 2017-07-21 DIAGNOSIS — Z0000 Encounter for general adult medical examination without abnormal findings: Secondary | ICD-10-CM | POA: Diagnosis not present

## 2017-07-21 DIAGNOSIS — Z13220 Encounter for screening for lipoid disorders: Secondary | ICD-10-CM | POA: Diagnosis not present

## 2017-07-21 MED ORDER — DILTIAZEM HCL 30 MG PO TABS: ORAL_TABLET | ORAL | 1 refills | Status: DC

## 2017-07-21 NOTE — Patient Instructions (Signed)
Cardizem 71m -- take 1 tablet every 4 hours AS NEEDED for heart rate >100 as long as top number blood pressure >100.   Try cardizem prior to taking a flecainide.

## 2017-07-21 NOTE — Progress Notes (Signed)
Primary Care Physician: Johnathan Haw, MD Referring Physician: [facility] triage EP: Johnathan Walker is a 47 y.o. male with a h/o paroxysmal afib in June of this year where he was treated with first onset afib. He was started on metoprolol. It was discussed that he snored with apnea episodes and sleep study was discussed. He was seen f/u with Johnathan Walker and his afib burden was low so he was to continue on metoprolol and baby asa with chadsvasc score of 1. If burden did increase flecainide was discussed.  He was seen in the afib clinic 8/23 for increased afib burden  that  was noted more so with his running. He runs 3-5 miles 3x a week. He is having 3-4 episodes a week,late afternoon that would last 1-5 hours. He had not tried any extra metoprolol for breakthrough afib.He is avoiding excessive caffeine, no alcohol, sleep study is still pending.  He was started on 50 mg flecainide and he had an ETT on flecainide with excellent effort without any EKG interval changes. After that he noted some increase in afib and was told by Johnathan Walker office to increase to 100 mg bid and was set up for repeat EKG.  This past Friday, he was running and toward the end of his run. His HR was noted to be around 160 bpm. The next thing he was aware of he had passed out and struck his head.Marland Kitchen He had the laceration closed with staples after presenting to the ER and CT of the head did not show any acute intracranial findings. He refused cardioversion and was told to redcue flecainde back to 50 mg bid.  In the afib clinic 9/17,f/u ER visit, he continues in atrial flutter at 85 bpm, tolerating OK. He is not on anticoagualtion. SInce he is out of the 48 hour window, he will have to be started on xarelto before he could be cardioverted, however, the pt would like to see Johnathan Walker to get scheduled for ablation. He is not going to run anymore until he gets afib further evaluated. He saw Johnathan Walker , who did  consult in the  ER and felt that he may have had 1:1 AV conduction and very rapid v rates leading to syncope.  F/u in afib clinic, s/p ablation 10/11.  He has not noted any afib since procedure. He has returned to doing some short runs and tolerated well. No further syncope. No swallowing or groin issues.  Urgent f/u in afib clinic, 2/24. When he was running on the treadmill yesterday, he felt his heart go out of rhythm. He took a 50 mg flecainide and converted 4 hours later. He is in SR today. No known triggers.  Today, he denies symptoms of palpitations, chest pain, shortness of breath, orthopnea, PND, lower extremity edema, dizziness,  or neurologic sequela. The patient is tolerating medications without difficulties and is otherwise without complaint today.   Past Medical History:  Diagnosis Date  . Atrial fibrillation with rapid ventricular response ([place]) 11/16/2016  . Essential hypertension 11/16/2016  . Hypercholesteremia   . Hypertension   . New onset atrial fibrillation ([place]) 11/15/2016   Past Surgical History:  Procedure Laterality Date  . ATRIAL FIBRILLATION ABLATION  03/17/2017  . ATRIAL FIBRILLATION ABLATION N/A 03/17/2017   Procedure: Atrial Fibrillation Ablation;  Surgeon: Johnathan Haw, MD;  Location: [facility];  Service: Cardiovascular;  Laterality: N/A;    Current Outpatient Medications  Medication Sig  Dispense Refill  . losartan (COZAAR) 50 MG tablet Take 50 mg by mouth daily.   2  . niacin (SLO-NIACIN) 500 MG tablet Take 1,500 mg by mouth at bedtime.    Marland Kitchen diltiazem (CARDIZEM) 30 MG tablet Take 1 tablet every 4 hours AS NEEDED for AFIB heart rate >100 45 tablet 1   No current facility-administered medications for this encounter.     No Known Allergies  Social History   Socioeconomic History  . Marital status: Married    Spouse name: Not on file  . Number of children: Not on file  . Years of education: Not on file  . Highest education level: Not  on file  Social Needs  . Financial resource strain: Not on file  . Food insecurity - worry: Not on file  . Food insecurity - inability: Not on file  . Transportation needs - medical: Not on file  . Transportation needs - non-medical: Not on file  Occupational History  . Not on file  Tobacco Use  . Smoking status: Never Smoker  . Smokeless tobacco: Never Used  Substance and Sexual Activity  . Alcohol use: No  . Drug use: No  . Sexual activity: Not on file  Other Topics Concern  . Not on file  Social History Narrative  . Not on file    Family History  Problem Relation Age of Onset  . Atrial fibrillation Mother   . Diabetes Father   . Hypertension Father   . Lung cancer Father     ROS- All systems are reviewed and negative except as per the HPI above  Physical Exam: Vitals:   07/21/17 1002  BP: 127/72  Pulse: 71  Weight: 177 lb 12.8 oz (80.6 kg)  Height: 6' (1.829 m)   Wt Readings from Last 3 Encounters:  07/21/17 177 lb 12.8 oz (80.6 kg)  07/05/17 180 lb (81.6 kg)  04/18/17 177 lb 9.6 oz (80.6 kg)    Labs: Lab Results  Component Value Date   NA 142 03/07/2017   K 4.4 03/07/2017   CL 104 03/07/2017   CO2 23 03/07/2017   GLUCOSE 98 03/07/2017   BUN 20 03/07/2017   CREATININE 0.86 03/07/2017   CALCIUM 9.5 03/07/2017   No results found for: INR No results found for: CHOL, HDL, LDLCALC, TRIG   GEN- The patient is well appearing, alert and oriented x 3 today.   Head- normocephalic, atraumatic Eyes-  Sclera clear, conjunctiva pink Ears- hearing intact Oropharynx- clear Neck- supple, no JVP Lymph- no cervical lymphadenopathy Lungs- Clear to ausculation bilaterally, normal work of breathing Heart- regular rate and rhythm, no murmurs, rubs or gallops, PMI not laterally displaced GI- soft, NT, ND, + BS Extremities- no clubbing, cyanosis, or edema MS- no significant deformity or atrophy Skin- no rash or lesion Psych- euthymic mood, full affect Neuro-  strength and sensation are intact  EKG- NSR at 71 bpm, pr int 146 ms, qrs int 98 ms, qtc 434 ms Echo-- Left ventricle: The cavity size was normal. Systolic function was normal. The estimated ejection fraction was in the range of 55% to 60%. Wall motion was normal; there were no regional wall motion abnormalities. Left ventricular diastolic function parameters were normal. - Atrial septum: No defect or patent foramen ovale was identified.   Assessment and Plan: 1. Paroxysmal afib Had an unsustained episode of  Irregular heart beat yesterday, pt states it felt like afib, responded well to 50 mg flecainide  Now off BB, daily  flecainide  Chadsvasc score of 1 (HTN), off xarelto now Will rx 30 mg cardizem, if another episode of irregular heart beat, take first and if in one hour doesn't  convert, can take 50 mg flecainide   F/u with Johnathan Walker as scheduled  afib clinic as needed  Butch Penny C. Carroll, [facility] [address] [place], [place] [address] [phone]

## 2017-08-18 ENCOUNTER — Other Ambulatory Visit (HOSPITAL_COMMUNITY): Payer: Self-pay | Admitting: Nurse Practitioner

## 2017-10-03 ENCOUNTER — Ambulatory Visit: Payer: BLUE CROSS/BLUE SHIELD | Admitting: Cardiology

## 2017-10-05 ENCOUNTER — Other Ambulatory Visit: Payer: Self-pay

## 2017-10-05 ENCOUNTER — Encounter: Payer: Self-pay | Admitting: Cardiology

## 2017-10-05 ENCOUNTER — Ambulatory Visit: Payer: BLUE CROSS/BLUE SHIELD | Admitting: Cardiology

## 2017-10-05 VITALS — BP 120/80 | HR 70 | Ht 74.0 in | Wt 177.0 lb

## 2017-10-05 DIAGNOSIS — I10 Essential (primary) hypertension: Secondary | ICD-10-CM | POA: Diagnosis not present

## 2017-10-05 DIAGNOSIS — I480 Paroxysmal atrial fibrillation: Secondary | ICD-10-CM | POA: Diagnosis not present

## 2017-10-05 NOTE — Progress Notes (Signed)
Electrophysiology Office Note   Date:  10/05/2017   ID:  Johnathan Walker, DOB 07-04-1970, MRN [id number]  PCP:  Johnathan Haw, MD  Cardiologist: Johnathan Primary Electrophysiologist:  Johnathan Azbell Meredith Leeds, MD    Chief Complaint  Patient presents with  . Atrial Fibrillation     History of Present Illness: Johnathan Walker is a 47 y.o. male who is being seen today for the evaluation of atrial fibrillation at the request of Johnathan Fess, MD. Presenting today for electrophysiology evaluation. He was hospitalized June 11th 2018 with new onset atrial fibrillation and rapid ventricular response. He was put on metoprolol. Echo at the time showed a normal ejection fraction of 55-60%. After being seen, he continued to have episodes of palpitations. He was put on 50 mg flecainide. He presented to the hospital on 9/14 after an episode of syncope. EKG showed atypical atrial flutter with rates of 240-250 and variable AV block. The patient declined cardioversion in the emergency room. He was started on Xarelto. Is s/p AF/flutter ablation 03/17/17. Metoprolol and flecainide stopped at the last visit.  Today, denies symptoms of palpitations, chest pain, shortness of breath, orthopnea, PND, lower extremity edema, claudication, dizziness, presyncope, syncope, bleeding, or neurologic sequela. The patient is tolerating medications without difficulties.  He has noted no further episodes of atrial fibrillation.  He has been able to exercise without issue.  He continues to run, with his heart rate much better controlled.   Past Medical History:  Diagnosis Date  . Atrial fibrillation with rapid ventricular response ([place]) 11/16/2016  . Essential hypertension 11/16/2016  . Hypercholesteremia   . Hypertension   . New onset atrial fibrillation ([place]) 11/15/2016   Past Surgical History:  Procedure Laterality Date  . ATRIAL FIBRILLATION ABLATION  03/17/2017  . ATRIAL FIBRILLATION ABLATION N/A 03/17/2017   Procedure: Atrial Fibrillation Ablation;  Surgeon: Johnathan Haw, MD;  Location: [facility];  Service: Cardiovascular;  Laterality: N/A;     Current Outpatient Medications  Medication Sig Dispense Refill  . diltiazem (CARDIZEM) 30 MG tablet TAKE 1 TABLET EVERY 4 HOURS AS NEEDED FOR AFIB HEART RATE >100 45 tablet 1  . glucosamine-chondroitin 500-400 MG tablet Take 2 tablets by mouth daily.    Marland Kitchen losartan (COZAAR) 50 MG tablet Take 50 mg by mouth daily.   2  . niacin (SLO-NIACIN) 500 MG tablet Take 1,500 mg by mouth at bedtime.    . Omega-3 Fatty Acids (FISH OIL OMEGA-3 PO) Take 2 capsules by mouth.     No current facility-administered medications for this visit.     Allergies:   Patient has no known allergies.   Social History:  The patient  reports that he has never smoked. He has never used smokeless tobacco. He reports that he does not drink alcohol or use drugs.   Family History:  The patient's family history includes Atrial fibrillation in his mother; Diabetes in his father; Hypertension in his father; Lung cancer in his father.    ROS:  Please see the history of present illness.   Otherwise, review of systems is positive for none.   All other systems are reviewed and negative.   PHYSICAL EXAM: VS:  BP 120/80   Pulse 70   Ht 6' 2"  (1.88 m)   Wt 177 lb (80.3 kg)   BMI 22.73 kg/m  , BMI Body mass index is 22.73 kg/m. GEN: Well nourished, well developed, in no acute distress  HEENT: normal  Neck: no JVD, carotid bruits,  or masses Cardiac: RRR; no murmurs, rubs, or gallops,no edema  Respiratory:  clear to auscultation bilaterally, normal work of breathing GI: soft, nontender, nondistended, + BS MS: no deformity or atrophy  Skin: warm and dry Neuro:  Strength and sensation are intact Psych: euthymic mood, full affect  EKG:  EKG is ordered today. Personal review of the ekg ordered shows SR, rate 70   Recent Labs: 11/16/2016: TSH 3.039 03/07/2017: BUN 20;  Creatinine, Ser 0.86; Hemoglobin 12.7; Platelets 194; Potassium 4.4; Sodium 142    Lipid Panel  No results found for: CHOL, TRIG, HDL, CHOLHDL, VLDL, LDLCALC, LDLDIRECT   Wt Readings from Last 3 Encounters:  10/05/17 177 lb (80.3 kg)  07/21/17 177 lb 12.8 oz (80.6 kg)  07/05/17 180 lb (81.6 kg)      Other studies Reviewed: Additional studies/ records that were reviewed today include: TTE 11/16/16, outside records - personally reviewed  Review of the above records today demonstrates:  - Left ventricle: The cavity size was normal. Systolic function was   normal. The estimated ejection fraction was in the range of 55%   to 60%. Wall motion was normal; there were no regional wall   motion abnormalities. Left ventricular diastolic function   parameters were normal. - Atrial septum: No defect or patent foramen ovale was identified.   ASSESSMENT AND PLAN:  1.  Paroxysmal atrial fibrillation: Stop Xarelto, flecainide, metoprolol at last visit.  Is no further atrial fibrillation.  Ablation October 2018.  He has PRN diltiazem but has not had to use this.  No changes at this time.  This patients CHA2DS2-VASc Score and unadjusted Ischemic Stroke Rate (% per year) is equal to 0.6 % stroke rate/year from a score of 1  Above score calculated as 1 point each if present [CHF, HTN, DM, Vascular=MI/PAD/Aortic Plaque, Age if 65-74, or Male] Above score calculated as 2 points each if present [Age > 75, or Stroke/TIA/TE]  2. Hypertension: Well-controlled.  No changes.    Current medicines are reviewed at length with the patient today.   The patient does not have concerns regarding his medicines.  The following changes were made today: None  Labs/ tests ordered today include:  Orders Placed This Encounter  Procedures  . EKG 12-Lead     Disposition:   FU with Johnathan Walker 6  months  Signed, Johnathan Zorn Meredith Leeds, MD  10/05/2017 8:50 AM     [facility] [address] [place] [address] [phone] (office) [phone] (fax)

## 2017-10-05 NOTE — Patient Instructions (Signed)
Medication Instructions:  Your physician recommends that you continue on your current medications as directed. Please refer to the Current Medication list given to you today.  Labwork: None ordered  Testing/Procedures: None ordered  Follow-Up: Your physician wants you to follow-up in: 6 months with Dr. Curt Bears.  You will receive a reminder letter in the mail two months in advance. If you don't receive a letter, please call our office to schedule the follow-up appointment.  * If you need a refill on your cardiac medications before your next appointment, please call your pharmacy.   *Please note that any paperwork needing to be filled out by the provider will need to be addressed at the front desk prior to seeing the provider. Please note that any FMLA, disability or other documents regarding health condition is subject to a $25.00 charge that must be received prior to completion of paperwork in the form of a money order or check.  Thank you for choosing [facility]!!   Trinidad Curet, RN [phone]

## 2017-11-14 DIAGNOSIS — M25511 Pain in right shoulder: Secondary | ICD-10-CM | POA: Diagnosis not present

## 2017-12-16 DIAGNOSIS — M25511 Pain in right shoulder: Secondary | ICD-10-CM | POA: Diagnosis not present

## 2018-02-17 DIAGNOSIS — M25511 Pain in right shoulder: Secondary | ICD-10-CM | POA: Diagnosis not present

## 2018-02-21 DIAGNOSIS — M722 Plantar fascial fibromatosis: Secondary | ICD-10-CM | POA: Diagnosis not present

## 2018-02-21 DIAGNOSIS — M79671 Pain in right foot: Secondary | ICD-10-CM | POA: Diagnosis not present

## 2018-02-21 DIAGNOSIS — M67871 Other specified disorders of synovium, right ankle and foot: Secondary | ICD-10-CM | POA: Diagnosis not present

## 2018-04-04 ENCOUNTER — Encounter: Payer: Self-pay | Admitting: Cardiology

## 2018-04-04 ENCOUNTER — Ambulatory Visit: Payer: BLUE CROSS/BLUE SHIELD | Admitting: Cardiology

## 2018-04-04 VITALS — BP 126/74 | HR 69 | Ht 74.0 in | Wt 174.6 lb

## 2018-04-04 DIAGNOSIS — I480 Paroxysmal atrial fibrillation: Secondary | ICD-10-CM

## 2018-04-04 DIAGNOSIS — I10 Essential (primary) hypertension: Secondary | ICD-10-CM | POA: Diagnosis not present

## 2018-04-04 NOTE — Progress Notes (Signed)
Electrophysiology Office Note   Date:  04/04/2018   ID:  Johnathan Walker, DOB 1971/04/10, MRN [id number]  PCP:  Johnathan Fess, MD  Cardiologist: Nahser Primary Electrophysiologist:  Johnathan Dall Meredith Leeds, MD    No chief complaint on file.    History of Present Illness: Johnathan Walker is a 47 y.o. male who is being seen today for the evaluation of atrial fibrillation at the request of Johnathan Haw, MD. Presenting today for electrophysiology evaluation. He was hospitalized June 11th 2018 with new onset atrial fibrillation and rapid ventricular response. He was put on metoprolol. Echo at the time showed a normal ejection fraction of 55-60%. After being seen, he continued to have episodes of palpitations. He was put on 50 mg flecainide. He presented to the hospital on 9/14 after an episode of syncope. EKG showed atypical atrial flutter with rates of 240-250 and variable AV block. The patient declined cardioversion in the emergency room. He was started on Xarelto. Is s/p AF/flutter ablation 03/17/17. Metoprolol and flecainide stopped at the last visit.  Today, denies symptoms of palpitations, chest pain, shortness of breath, orthopnea, PND, lower extremity edema, claudication, dizziness, presyncope, syncope, bleeding, or neurologic sequela. The patient is tolerating medications without difficulties.  Overall he is feeling well.  He is noted no further episodes of atrial fibrillation.  He is continued to exercise, running approximately a 5K a few times a week.  He has occasional palpitations that he says feel like a single beat.  This is also improving.   Past Medical History:  Diagnosis Date  . Atrial fibrillation with rapid ventricular response ([place]) 11/16/2016  . Essential hypertension 11/16/2016  . Hypercholesteremia   . Hypertension   . New onset atrial fibrillation ([place]) 11/15/2016   Past Surgical History:  Procedure Laterality Date  . ATRIAL FIBRILLATION ABLATION  03/17/2017  .  ATRIAL FIBRILLATION ABLATION N/A 03/17/2017   Procedure: Atrial Fibrillation Ablation;  Surgeon: Johnathan Haw, MD;  Location: [facility];  Service: Cardiovascular;  Laterality: N/A;     Current Outpatient Medications  Medication Sig Dispense Refill  . diltiazem (CARDIZEM) 30 MG tablet TAKE 1 TABLET EVERY 4 HOURS AS NEEDED FOR AFIB HEART RATE >100 45 tablet 1  . losartan (COZAAR) 50 MG tablet Take 50 mg by mouth daily.   2  . niacin (SLO-NIACIN) 500 MG tablet Take 1,500 mg by mouth at bedtime.    . Omega-3 Fatty Acids (FISH OIL OMEGA-3 PO) Take 2 capsules by mouth.    . Turmeric 500 MG CAPS Take 2 tablets by mouth daily.     No current facility-administered medications for this visit.     Allergies:   Patient has no known allergies.   Social History:  The patient  reports that he has never smoked. He has never used smokeless tobacco. He reports that he does not drink alcohol or use drugs.   Family History:  The patient's family history includes Atrial fibrillation in his mother; Diabetes in his father; Hypertension in his father; Lung cancer in his father.    ROS:  Please see the history of present illness.   Otherwise, review of systems is positive for none.   All other systems are reviewed and negative.   PHYSICAL EXAM: VS:  BP 126/74   Pulse 69   Ht 6' 2"  (1.88 m)   Wt 174 lb 9.6 oz (79.2 kg)   SpO2 98%   BMI 22.42 kg/m  , BMI Body mass index is 22.42  kg/m. GEN: Well nourished, well developed, in no acute distress  HEENT: normal  Neck: no JVD, carotid bruits, or masses Cardiac: RRR; no murmurs, rubs, or gallops,no edema  Respiratory:  clear to auscultation bilaterally, normal work of breathing GI: soft, nontender, nondistended, + BS MS: no deformity or atrophy  Skin: warm and dry Neuro:  Strength and sensation are intact Psych: euthymic mood, full affect  EKG:  EKG is ordered today. Personal review of the ekg ordered shows sinus rhythm, left axis  deviation, rate 69  Recent Labs: No results found for requested labs within last 8760 hours.    Lipid Panel  No results found for: CHOL, TRIG, HDL, CHOLHDL, VLDL, LDLCALC, LDLDIRECT   Wt Readings from Last 3 Encounters:  04/04/18 174 lb 9.6 oz (79.2 kg)  10/05/17 177 lb (80.3 kg)  07/21/17 177 lb 12.8 oz (80.6 kg)      Other studies Reviewed: Additional studies/ records that were reviewed today include: TTE 11/16/16, outside records - personally reviewed  Review of the above records today demonstrates:  - Left ventricle: The cavity size was normal. Systolic function was   normal. The estimated ejection fraction was in the range of 55%   to 60%. Wall motion was normal; there were no regional wall   motion abnormalities. Left ventricular diastolic function   parameters were normal. - Atrial septum: No defect or patent foramen ovale was identified.   ASSESSMENT AND PLAN:  1.  Paroxysmal atrial fibrillation: Currently not on anticoagulation due to low stroke risk.  Not on antiarrhythmics and is feeling well without issue.  This patients CHA2DS2-VASc Score and unadjusted Ischemic Stroke Rate (% per year) is equal to 0.6 % stroke rate/year from a score of 1  Above score calculated as 1 point each if present [CHF, HTN, DM, Vascular=MI/PAD/Aortic Plaque, Age if 65-74, or Male] Above score calculated as 2 points each if present [Age > 75, or Stroke/TIA/TE]  2. Hypertension: Well-controlled today.  No changes.    Current medicines are reviewed at length with the patient today.   The patient does not have concerns regarding his medicines.  The following changes were made today: None  Labs/ tests ordered today include:  Orders Placed This Encounter  Procedures  . EKG 12-Lead     Disposition:   FU with Larkin Morelos 6 months  Signed, Randi Poullard Meredith Leeds, MD  04/04/2018 3:10 PM     [place] [address] [phone]  (office) [phone] (fax)

## 2018-04-04 NOTE — Patient Instructions (Signed)
Medication Instructions:  Your physician recommends that you continue on your current medications as directed. Please refer to the Current Medication list given to you today  If you need a refill on your cardiac medications before your next appointment, please call your pharmacy.   Lab work: None ordered  Testing/Procedures: None ordered   Follow-Up: At [facility], you and your health needs are our priority.  As part of our continuing mission to provide you with exceptional heart care, we have created designated Provider Care Teams.  These Care Teams include your primary Cardiologist (physician) and Advanced Practice Providers (APPs -  Physician Assistants and Nurse Practitioners) who all work together to provide you with the care you need, when you need it. You will need a follow up appointment in 6 months.  Please call our office 2 months in advance to schedule this appointment.  You may see Will Martin Camnitz, MD or one of the following Advanced Practice Providers on your designated Care Team:   Amber Seiler, NP . Renee Ursuy, PA-C  Thank you for choosing [facility]!!   Jsoeph Podesta, RN [phone]      

## 2018-07-20 DIAGNOSIS — R05 Cough: Secondary | ICD-10-CM | POA: Diagnosis not present

## 2018-07-20 DIAGNOSIS — J0110 Acute frontal sinusitis, unspecified: Secondary | ICD-10-CM | POA: Diagnosis not present

## 2018-08-01 DIAGNOSIS — D649 Anemia, unspecified: Secondary | ICD-10-CM | POA: Diagnosis not present

## 2018-08-01 DIAGNOSIS — I10 Essential (primary) hypertension: Secondary | ICD-10-CM | POA: Diagnosis not present

## 2018-08-01 DIAGNOSIS — Z0000 Encounter for general adult medical examination without abnormal findings: Secondary | ICD-10-CM | POA: Diagnosis not present

## 2018-08-01 DIAGNOSIS — Z8249 Family history of ischemic heart disease and other diseases of the circulatory system: Secondary | ICD-10-CM | POA: Diagnosis not present

## 2018-08-01 DIAGNOSIS — Z13220 Encounter for screening for lipoid disorders: Secondary | ICD-10-CM | POA: Diagnosis not present

## 2018-08-04 DIAGNOSIS — Z8679 Personal history of other diseases of the circulatory system: Secondary | ICD-10-CM | POA: Diagnosis not present

## 2018-08-04 DIAGNOSIS — I10 Essential (primary) hypertension: Secondary | ICD-10-CM | POA: Diagnosis not present

## 2018-08-04 DIAGNOSIS — Z0000 Encounter for general adult medical examination without abnormal findings: Secondary | ICD-10-CM | POA: Diagnosis not present

## 2018-08-04 DIAGNOSIS — D649 Anemia, unspecified: Secondary | ICD-10-CM | POA: Diagnosis not present

## 2018-09-12 DIAGNOSIS — L739 Follicular disorder, unspecified: Secondary | ICD-10-CM | POA: Diagnosis not present

## 2018-09-12 DIAGNOSIS — D229 Melanocytic nevi, unspecified: Secondary | ICD-10-CM | POA: Diagnosis not present

## 2018-09-12 DIAGNOSIS — L570 Actinic keratosis: Secondary | ICD-10-CM | POA: Diagnosis not present

## 2018-09-26 ENCOUNTER — Telehealth: Payer: Self-pay | Admitting: *Deleted

## 2018-09-26 NOTE — Telephone Encounter (Signed)

## 2018-10-03 ENCOUNTER — Telehealth (INDEPENDENT_AMBULATORY_CARE_PROVIDER_SITE_OTHER): Payer: BLUE CROSS/BLUE SHIELD | Admitting: Cardiology

## 2018-10-03 ENCOUNTER — Other Ambulatory Visit: Payer: Self-pay

## 2018-10-03 ENCOUNTER — Encounter: Payer: Self-pay | Admitting: Cardiology

## 2018-10-03 DIAGNOSIS — I480 Paroxysmal atrial fibrillation: Secondary | ICD-10-CM

## 2018-10-03 NOTE — Progress Notes (Signed)
Electrophysiology TeleHealth Note   Due to national recommendations of social distancing due to COVID 19, an audio/video telehealth visit is felt to be most appropriate for this patient at this time.  See Epic message for the patient's consent to telehealth for [facility].   Date:  10/03/2018   ID:  Johnathan Walker, DOB 07/16/1970, MRN [id number]  Location: patient's home  Provider location: [address] [place]  Evaluation Performed: Follow-up visit  PCP:  Hulan Fess, MD  Cardiologist:  No primary care provider on file.  Electrophysiologist:  Dr Curt Bears  Chief Complaint:  AF  History of Present Illness:    Johnathan Walker is a 48 y.o. male who presents via audio/video conferencing for a telehealth visit today.  Since last being seen in our clinic, the patient reports doing very well.  Today, he denies symptoms of palpitations, chest pain, shortness of breath,  lower extremity edema, dizziness, presyncope, or syncope.  The patient is otherwise without complaint today.  The patient denies symptoms of fevers, chills, cough, or new SOB worrisome for COVID 19.  History of AF/flutter s/p ablation 03/17/17.  Today, denies symptoms of palpitations, chest pain, shortness of breath, orthopnea, PND, lower extremity edema, claudication, dizziness, presyncope, syncope, bleeding, or neurologic sequela. The patient is tolerating medications without difficulties.  Overall he is doing well.  He has no chest pain or shortness of breath.  He is able to do all his daily activities.  He is continued to run.  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  He has had minimal palpitations lasting less than a minute.  Past Medical History:  Diagnosis Date  . Atrial fibrillation with rapid ventricular response ([place]) 11/16/2016  . Essential hypertension 11/16/2016  . Hypercholesteremia   . Hypertension   . New onset atrial  fibrillation ([place]) 11/15/2016    Past Surgical History:  Procedure Laterality Date  . ATRIAL FIBRILLATION ABLATION  03/17/2017  . ATRIAL FIBRILLATION ABLATION N/A 03/17/2017   Procedure: Atrial Fibrillation Ablation;  Surgeon: Constance Haw, MD;  Location: [facility];  Service: Cardiovascular;  Laterality: N/A;    Current Outpatient Medications  Medication Sig Dispense Refill  . diltiazem (CARDIZEM) 30 MG tablet TAKE 1 TABLET EVERY 4 HOURS AS NEEDED FOR AFIB HEART RATE >100 45 tablet 1  . losartan (COZAAR) 50 MG tablet Take 50 mg by mouth daily.   2  . niacin (SLO-NIACIN) 500 MG tablet Take 1,500 mg by mouth at bedtime.    . Omega-3 Fatty Acids (FISH OIL OMEGA-3 PO) Take 2 capsules by mouth.    . Turmeric 500 MG CAPS Take 2 tablets by mouth daily.     No current facility-administered medications for this visit.     Allergies:   Patient has no known allergies.   Social History:  The patient  reports that he has never smoked. He has never used smokeless tobacco. He reports that he does not drink alcohol or use drugs.   Family History:  The patient's  family history includes Atrial fibrillation in his mother; Diabetes in his father; Hypertension in his father; Lung cancer in his father.   ROS:  Please see the history of present illness.   All other systems are personally reviewed and negative.    Exam:    Vital Signs:  BP 125/78   Well appearing, alert and conversant, regular work of breathing,  good skin  color Eyes- anicteric, neuro- grossly intact, skin- no apparent rash or lesions or cyanosis, mouth- oral mucosa is pink   Labs/Other Tests and Data Reviewed:    Recent Labs: No results found for requested labs within last 8760 hours.   Wt Readings from Last 3 Encounters:  04/04/18 174 lb 9.6 oz (79.2 kg)  10/05/17 177 lb (80.3 kg)  07/21/17 177 lb 12.8 oz (80.6 kg)     Other studies personally reviewed: Additional studies/ records that were reviewed today  include: ECG  04/04/18 personally reviewed Review of the above records today demonstrates:  SR, RBBB   ASSESSMENT & PLAN:    1.  Paroxysmal atrial fibrillation: no anticoagulation due to low stroke risk. He has noted minimal palpitations since his ablation.  No changes.  This patients CHA2DS2-VASc Score and unadjusted Ischemic Stroke Rate (% per year) is equal to 0.6 % stroke rate/year from a score of 1  Above score calculated as 1 point each if present [CHF, HTN, DM, Vascular=MI/PAD/Aortic Plaque, Age if 65-74, or Male] Above score calculated as 2 points each if present [Age > 75, or Stroke/TIA/TE]     2. Hypertension: Currently well controlled   COVID 19 screen The patient denies symptoms of COVID 19 at this time.  The importance of social distancing was discussed today.  Follow-up: 6 months  Current medicines are reviewed at length with the patient today.   The patient does not have concerns regarding his medicines.  The following changes were made today:  none  Labs/ tests ordered today include:  No orders of the defined types were placed in this encounter.    Patient Risk:  after full review of this patients clinical status, I feel that they are at moderate risk at this time.  Today, I have spent 12 minutes with the patient with telehealth technology discussing atrial fibrillation.    Signed,  Meredith Leeds, MD  10/03/2018 3:54 PM     [place] [address] [place] [address] [phone] (office) [phone] (fax)

## 2019-06-18 ENCOUNTER — Encounter: Payer: Self-pay | Admitting: Cardiology

## 2019-06-18 ENCOUNTER — Ambulatory Visit: Payer: BLUE CROSS/BLUE SHIELD | Admitting: Cardiology

## 2019-06-18 ENCOUNTER — Other Ambulatory Visit: Payer: Self-pay

## 2019-06-18 VITALS — BP 148/82 | HR 71 | Ht 74.0 in | Wt 179.8 lb

## 2019-06-18 DIAGNOSIS — I480 Paroxysmal atrial fibrillation: Secondary | ICD-10-CM

## 2019-06-18 NOTE — Patient Instructions (Signed)
Medication Instructions:  Your physician recommends that you continue on your current medications as directed. Please refer to the Current Medication list given to you today.  * If you need a refill on your cardiac medications before your next appointment, please call your pharmacy.   Labwork: None ordered If you have labs (blood work) drawn today and your tests are completely normal, you will receive your results only by:  [facility] (if you have MyChart) OR  A paper copy in the mail If you have any lab test that is abnormal or we need to change your treatment, we will call you to review the results.  Testing/Procedures: None ordered  Follow-Up: At [facility], you and your health needs are our priority.  As part of our continuing mission to provide you with exceptional heart care, we have created designated Provider Care Teams.  These Care Teams include your primary Cardiologist (physician) and Advanced Practice Providers (APPs -  Physician Assistants and Nurse Practitioners) who all work together to provide you with the care you need, when you need it.  You will need a follow up appointment in 1 year.  Please call our office 2 months in advance to schedule this appointment.  You may see Dr Curt Bears or one of the following Advanced Practice Providers on your designated Care Team:    Chanetta Marshall, NP  Tommye Standard, PA-C  Oda Kilts, [place]   Thank you for choosing [facility]!!   Trinidad Curet, RN [phone]  Any Other Special Instructions Will Be Listed Below (If Applicable).

## 2019-06-18 NOTE — Progress Notes (Signed)
Electrophysiology Office Note   Date:  06/18/2019   ID:  Johnathan Walker, DOB November 20, 1970, MRN [id number]  PCP:  Johnathan Fess, MD  Cardiologist: Johnathan Walker Primary Electrophysiologist:  Johnathan Walker Johnathan Leeds, MD    No chief complaint on file.    History of Present Illness: Johnathan Walker is a 49 y.o. male who is being seen today for the evaluation of atrial fibrillation at the request of Johnathan Fess, MD. Presenting today for electrophysiology evaluation. He was hospitalized June 11th 2018 with new onset atrial fibrillation and rapid ventricular response. He was put on metoprolol. Echo at the time showed a normal ejection fraction of 55-60%. After being seen, he continued to have episodes of palpitations. He was put on 50 mg flecainide. He presented to the hospital on 9/14 after an episode of syncope. EKG showed atypical atrial flutter with rates of 240-250 and variable AV block. The patient declined cardioversion in the emergency room. He was started on Xarelto. Is s/p AF/flutter ablation 03/17/17.   Today, denies symptoms of palpitations, chest pain, shortness of breath, orthopnea, PND, lower extremity edema, claudication, dizziness, presyncope, syncope, bleeding, or neurologic sequela. The patient is tolerating medications without difficulties.  Overall he has been doing well.  He has noted intermittent palpitations that last up to 5 seconds.  He continues to exercise, running up to 40 miles a week.  He has increased this from 15 miles and has not had any further issues with atrial fibrillation.   Past Medical History:  Diagnosis Date  . Atrial fibrillation with rapid ventricular response ([place]) 11/16/2016  . Essential hypertension 11/16/2016  . Hypercholesteremia   . Hypertension   . New onset atrial fibrillation ([place]) 11/15/2016   Past Surgical History:  Procedure Laterality Date  . ATRIAL FIBRILLATION ABLATION  03/17/2017  . ATRIAL FIBRILLATION ABLATION N/A 03/17/2017   Procedure:  Atrial Fibrillation Ablation;  Surgeon: Johnathan Haw, MD;  Location: [facility];  Service: Cardiovascular;  Laterality: N/A;     Current Outpatient Medications  Medication Sig Dispense Refill  . diltiazem (CARDIZEM) 30 MG tablet TAKE 1 TABLET EVERY 4 HOURS AS NEEDED FOR AFIB HEART RATE >100 45 tablet 1  . losartan (COZAAR) 50 MG tablet Take 50 mg by mouth daily.   2  . niacin (SLO-NIACIN) 500 MG tablet Take 1,500 mg by mouth at bedtime.    . Omega-3 Fatty Acids (FISH OIL OMEGA-3 PO) Take 2 capsules by mouth.     No current facility-administered medications for this visit.    Allergies:   Patient has no known allergies.   Social History:  The patient  reports that he has never smoked. He has never used smokeless tobacco. He reports that he does not drink alcohol or use drugs.   Family History:  The patient's family history includes Atrial fibrillation in his mother; Diabetes in his father; Hypertension in his father; Lung cancer in his father.    ROS:  Please see the history of present illness.   Otherwise, review of systems is positive for none.   All other systems are reviewed and negative.   PHYSICAL EXAM: VS:  BP (!) 148/82   Pulse 71   Ht 6' 2"  (1.88 m)   Wt 179 lb 12.8 oz (81.6 kg)   SpO2 99%   BMI 23.08 kg/m  , BMI Body mass index is 23.08 kg/m. GEN: Well nourished, well developed, in no acute distress  HEENT: normal  Neck: no JVD, carotid bruits, or masses Cardiac:  RRR; no murmurs, rubs, or gallops,no edema  Respiratory:  clear to auscultation bilaterally, normal work of breathing GI: soft, nontender, nondistended, + BS MS: no deformity or atrophy  Skin: warm and dry Neuro:  Strength and sensation are intact Psych: euthymic mood, full affect  EKG:  EKG is ordered today. Personal review of the ekg ordered shows sinus rhythm, rate 71  Recent Labs: No results found for requested labs within last 8760 hours.    Lipid Panel  No results found for:  CHOL, TRIG, HDL, CHOLHDL, VLDL, LDLCALC, LDLDIRECT   Wt Readings from Last 3 Encounters:  06/18/19 179 lb 12.8 oz (81.6 kg)  04/04/18 174 lb 9.6 oz (79.2 kg)  10/05/17 177 lb (80.3 kg)      Other studies Reviewed: Additional studies/ records that were reviewed today include: TTE 11/16/16, outside records - personally reviewed  Review of the above records today demonstrates:  - Left ventricle: The cavity size was normal. Systolic function was   normal. The estimated ejection fraction was in the range of 55%   to 60%. Wall motion was normal; there were no regional wall   motion abnormalities. Left ventricular diastolic function   parameters were normal. - Atrial septum: No defect or patent foramen ovale was identified.   ASSESSMENT AND PLAN:  1.  Paroxysmal atrial fibrillation: Status post ablation 03/17/2017.  CHA2DS2-VASc of 1.  He is fortunately remained in sinus rhythm.  No changes.  2. Hypertension: Blood pressure is elevated today but generally in the 120s/70s at home.  No changes.    Current medicines are reviewed at length with the patient today.   The patient does not have concerns regarding his medicines.  The following changes were made today: None  Labs/ tests ordered today include:  Orders Placed This Encounter  Procedures  . EKG 12-Lead     Disposition:   FU with Johnathan Walker 12 months  Signed, Johnathan Walker Johnathan Leeds, MD  06/18/2019 9:50 AM     [facility] [address] [place] [address] [phone] (office) [phone] (fax)

## 2019-09-05 DIAGNOSIS — Z0000 Encounter for general adult medical examination without abnormal findings: Secondary | ICD-10-CM | POA: Diagnosis not present

## 2019-09-05 DIAGNOSIS — Z125 Encounter for screening for malignant neoplasm of prostate: Secondary | ICD-10-CM | POA: Diagnosis not present

## 2019-09-05 DIAGNOSIS — D649 Anemia, unspecified: Secondary | ICD-10-CM | POA: Diagnosis not present

## 2019-09-05 DIAGNOSIS — Z8249 Family history of ischemic heart disease and other diseases of the circulatory system: Secondary | ICD-10-CM | POA: Diagnosis not present

## 2019-09-06 ENCOUNTER — Telehealth: Payer: Self-pay | Admitting: Oncology

## 2019-09-06 NOTE — Telephone Encounter (Signed)
Received a new hem referral from Dr. Rex Kras for mild anemia and leukopenia. Pt has been cld and scheduled to see Dr. Alen Blew on 3/21 at 2pm. Pt aware to arrive 15 minutes early.

## 2019-09-13 ENCOUNTER — Other Ambulatory Visit: Payer: Self-pay

## 2019-09-13 ENCOUNTER — Ambulatory Visit: Payer: BC Managed Care – PPO | Admitting: Sports Medicine

## 2019-09-13 VITALS — BP 124/82 | Ht 74.0 in | Wt 174.0 lb

## 2019-09-13 DIAGNOSIS — S76219A Strain of adductor muscle, fascia and tendon of unspecified thigh, initial encounter: Secondary | ICD-10-CM

## 2019-09-13 NOTE — Progress Notes (Signed)
Gauge Winski - 49 y.o. male MRN [id number]  Date of birth: Oct 15, 1970  SUBJECTIVE:   CC: right hip pain  49 yo male runner presenting with right anterior hip pain for the past month.   He reports that he went for a 16 mile run one Sunday about a month ago.  He did not have any pain during the run but the following day he felt pain in his groin and anterior hip during his run.  Since this time he has felt pain the first 3 to 4 miles of his runs and he runs with a slight limp but the pain eases off after a bit.  He typically runs about 30 miles a week and his longest run is typically 13 miles.  No significant increase in mileage recently.  No prior hip pain.  He has had issues with his psoas before.   He has not taken any medicines for this and has not tried any particular stretches as he did not want to hurt his hip worse. He typically stretches about 15 minutes daily and is very tight at baseline. His mother broke both of her hips and he just wanted to make sure that this is not coming from his hip joint.   He also reports occasional pain in his heel that bothers him from time to time. Has not tried any particular interventions.  ROS: No unexpected weight loss, swelling, instability, muscle pain, numbness/tingling, redness, otherwise see HPI   PMHx - Updated and reviewed.  Contributory factors include: Negative PSHx - Updated and reviewed.  Contributory factors include:  Negative FHx - Updated and reviewed.  Contributory factors include:  Negative Social Hx - Updated and reviewed. Contributory factors include: Negative Medications - reviewed   DATA REVIEWED: none  PHYSICAL EXAM:  VS: BP:124/82  HR: bpm  TEMP: ( )  RESP:   HT:6' 2"  (188 cm)   WT:174 lb (78.9 kg)  BMI:22.33 PHYSICAL EXAM: Gen: NAD, alert, cooperative with exam, well-appearing HEENT: clear conjunctiva,  CV:  no edema, capillary refill brisk, normal rate Resp: non-labored Skin: no rashes, normal turgor  Neuro: no  gross deficits.  Psych:  alert and oriented  Right Hip:  - Inspection: No gross deformity, no swelling, erythema, or ecchymosis - Palpation: TTP over groin (adductor/sartorius area) - ROM: Normal range of motion on Flexion, extension, abduction, internal and external rotation - Strength: Normal strength. No pain with resisted hip flexion, hip adduction, abduction - Neuro/vasc: NV intact distally - Special Tests: Negative FABER and FADIR.  Negative Scour.  Negative Trendelenberg.  Negative Ober's. Thomas test showing tight iliopsoas muscle.  Right heel: Normal inspection. TTP over calcaneus with squeeze. No TTP over posterior tibialis or tarsal tunnel. Able to passively dorsiflex foot to neutral Normal strength NVI.    ASSESSMENT & PLAN:   49 year old man presenting with right groin pain for 1 month.  Suspect that he has a strain of one of his adductor muscles. In addition, he has tight iliopsoas and hip flexors that are probably contributing to his pain. Reassured him that this was not coming from hip joint as he had good range of motion of hip without any pain.   Provided stretches for iliopsoas and abductor muscles.  In addition he has symptoms of Achilles tendinitis; provided Achilles rehab exercises today as well.  Patient seen and evaluated with the sports medicine fellow.  I agree with the above plan of care.  This appears to be an abductor tendon strain.  Treatment  as above.  If symptoms persist consider merits of x-ray as well as formal physical therapy.  Follow-up for ongoing or recalcitrant issues.

## 2019-09-26 ENCOUNTER — Inpatient Hospital Stay: Payer: BC Managed Care – PPO | Attending: Oncology | Admitting: Oncology

## 2019-09-26 ENCOUNTER — Other Ambulatory Visit: Payer: Self-pay

## 2019-09-26 VITALS — BP 143/85 | HR 64 | Temp 98.5°F | Resp 20 | Ht 74.0 in | Wt 178.7 lb

## 2019-09-26 DIAGNOSIS — I10 Essential (primary) hypertension: Secondary | ICD-10-CM | POA: Diagnosis not present

## 2019-09-26 DIAGNOSIS — I4891 Unspecified atrial fibrillation: Secondary | ICD-10-CM | POA: Diagnosis not present

## 2019-09-26 DIAGNOSIS — E7800 Pure hypercholesterolemia, unspecified: Secondary | ICD-10-CM | POA: Insufficient documentation

## 2019-09-26 DIAGNOSIS — D709 Neutropenia, unspecified: Secondary | ICD-10-CM

## 2019-09-26 DIAGNOSIS — E785 Hyperlipidemia, unspecified: Secondary | ICD-10-CM | POA: Diagnosis not present

## 2019-09-26 DIAGNOSIS — Z79899 Other long term (current) drug therapy: Secondary | ICD-10-CM | POA: Diagnosis not present

## 2019-09-26 NOTE — Progress Notes (Signed)
Reason for the request:    Leukocytopenia  HPI: I was asked by Dr. Rex Kras  to evaluate Mr. Staffieri for abnormal CBC.  He is a 49 year old man history of hypertension and hyperlipidemia as well as atrial fibrillation.  He was evaluated by Dr. Deirdre Pippins in March 2021 and a CBC showed a white cell count of 3.7, hemoglobin of 13.6 with a platelet count of 186.  His neutrophil percentage was at 34.9 slightly elevated lymphocyte percentage of 49.7.  Previous CBC in 2020 showed a white cell count of 4.0 hemoglobin 13.2.  His neutrophil percentage was 28.5% with mildly elevated lymphocyte count.  Previous counts dating back to 2018 showed a fluctuating white cell count is low at 3.6 in 2018 and normal range at other times.  Clinically, he is asymptomatic from these findings.  Denies any nausea, fatigue or recurrent infections.  Denies any abdominal pain or early satiety.  He remains active and continues to run regularly.   He does not report any headaches, blurry vision, syncope or seizures. Does not report any fevers, chills or sweats.  Does not report any cough, wheezing or hemoptysis.  Does not report any chest pain, palpitation, orthopnea or leg edema.  Does not report any nausea, vomiting or abdominal pain.  Does not report any constipation or diarrhea.  Does not report any skeletal complaints.    Does not report frequency, urgency or hematuria.  Does not report any skin rashes or lesions. Does not report any heat or cold intolerance.  Does not report any lymphadenopathy or petechiae.  Does not report any anxiety or depression.  Remaining review of systems is negative.    Past Medical History:  Diagnosis Date  . Atrial fibrillation with rapid ventricular response ([place]) 11/16/2016  . Essential hypertension 11/16/2016  . Hypercholesteremia   . Hypertension   . New onset atrial fibrillation ([place]) 11/15/2016  :  Past Surgical History:  Procedure Laterality Date  . ATRIAL FIBRILLATION ABLATION  03/17/2017   . ATRIAL FIBRILLATION ABLATION N/A 03/17/2017   Procedure: Atrial Fibrillation Ablation;  Surgeon: Constance Haw, MD;  Location: [facility];  Service: Cardiovascular;  Laterality: N/A;  :   Current Outpatient Medications:  .  diltiazem (CARDIZEM) 30 MG tablet, TAKE 1 TABLET EVERY 4 HOURS AS NEEDED FOR AFIB HEART RATE >100, Disp: 45 tablet, Rfl: 1 .  losartan (COZAAR) 50 MG tablet, Take 50 mg by mouth daily. , Disp: , Rfl: 2 .  niacin (SLO-NIACIN) 500 MG tablet, Take 1,500 mg by mouth at bedtime., Disp: , Rfl:  .  Omega-3 Fatty Acids (FISH OIL OMEGA-3 PO), Take 2 capsules by mouth., Disp: , Rfl: :  No Known Allergies:  Family History  Problem Relation Age of Onset  . Atrial fibrillation Mother   . Diabetes Father   . Hypertension Father   . Lung cancer Father   :  Social History   Socioeconomic History  . Marital status: Married    Spouse name: Not on file  . Number of children: Not on file  . Years of education: Not on file  . Highest education level: Not on file  Occupational History  . Not on file  Tobacco Use  . Smoking status: Never Smoker  . Smokeless tobacco: Never Used  Substance and Sexual Activity  . Alcohol use: No  . Drug use: No  . Sexual activity: Not on file  Other Topics Concern  . Not on file  Social History Narrative  . Not  on file   Social Determinants of Health   Financial Resource Strain:   . Difficulty of Paying Living Expenses:   Food Insecurity:   . Worried About Charity fundraiser in the Last Year:   . Arboriculturist in the Last Year:   Transportation Needs:   . Film/video editor (Medical):   Marland Kitchen Lack of Transportation (Non-Medical):   Physical Activity:   . Days of Exercise per Week:   . Minutes of Exercise per Session:   Stress:   . Feeling of Stress :   Social Connections:   . Frequency of Communication with Friends and Family:   . Frequency of Social Gatherings with Friends and Family:   . Attends Religious  Services:   . Active Member of Clubs or Organizations:   . Attends Archivist Meetings:   Marland Kitchen Marital Status:   Intimate Partner Violence:   . Fear of Current or Ex-Partner:   . Emotionally Abused:   Marland Kitchen Physically Abused:   . Sexually Abused:   :  Pertinent items are noted in HPI.  Exam: Blood pressure (!) 143/85, pulse 64, temperature 98.5 F (36.9 C), temperature source Temporal, resp. rate 20, height 6' 2"  (1.88 m), weight 178 lb 11.2 oz (81.1 kg), SpO2 100 %.  ECOG 0  General appearance: alert and cooperative appeared without distress. Head: atraumatic without any abnormalities. Eyes: conjunctivae/corneas clear. PERRL.  Sclera anicteric. Throat: lips, mucosa, and tongue normal; without oral thrush or ulcers. Resp: clear to auscultation bilaterally without rhonchi, wheezes or dullness to percussion. Cardio: regular rate and rhythm, S1, S2 normal, no murmur, click, rub or gallop GI: soft, non-tender; bowel sounds normal; no masses,  no organomegaly Skin: Skin color, texture, turgor normal. No rashes or lesions Lymph nodes: Cervical, supraclavicular, and axillary nodes normal. Neurologic: Grossly normal without any motor, sensory or deep tendon reflexes. Musculoskeletal: No joint deformity or effusion.    Assessment and Plan:   49 year old man with:  1.  Leukocytopenia associated with mild neutropenia: These findings dating back to at least 2018 with fluctuating counts during this interval.  His recent CBC showed a white cell count of 3.7 with an absolute neutrophil count 1300.  His hemoglobin and platelet count otherwise normal.  The differential diagnosis was reviewed at this time.  Reactive and cyclic neutropenia remains the most likely etiology.  Primary hematological condition such as lymphoproliferative disorder, myelodysplastic syndrome are considered unlikely.  Autoimmune disease is also considered less likely at this time with the lack of symptoms.  From a  management standpoint I recommended continued monitoring at this time.  I will repeat a CBC in 6 months for repeat evaluation.  2.  Low hematocrit: His hemoglobin is normal with normal indices and normal iron studies.  I do not think there is any evidence of blood disorder at this time.  3.  Follow-up: Will be in 6 months for repeat follow-up.   45  minutes were dedicated to this visit. The time was spent on reviewing laboratory data,  discussing treatment options, discussing differential diagnosis and answering questions regarding future plan.     A copy of this consult has been forwarded to the requesting physician.

## 2019-09-27 ENCOUNTER — Telehealth: Payer: Self-pay | Admitting: Oncology

## 2019-09-27 NOTE — Telephone Encounter (Signed)
Scheduled appt per 4/21 los. °

## 2019-10-01 IMAGING — CT CT HEART MORPH/PULM VEIN W/ CM & W/O CA SCORE
2 of 6 series · 10 of 20 positions shown, 12 images · IV contrast (APPLIED)
Comparison: None.

CLINICAL DATA: 46-year-old male with atrial fibrillation scheduled
for an ablation.

EXAM:
Cardiac CT/CTA
TECHNIQUE: The patient was scanned on a Siemens Somatom scanner.

[Series 7: best diast · axial · 0.38mm/px · z∈[+1031,+1150]mm · 5 of 445 slices shown, 7 images]
[im 75/445  vessel]
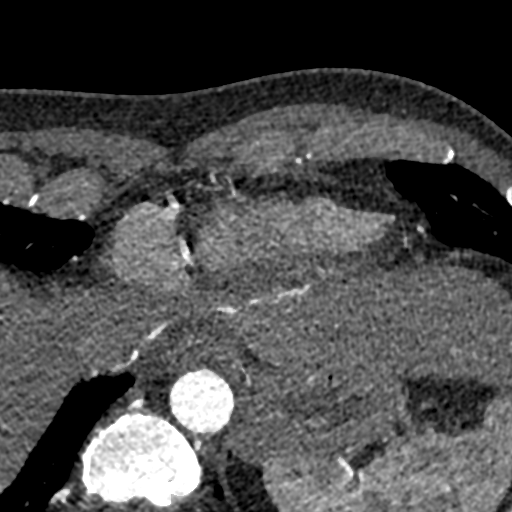
[im 75/445  lung]
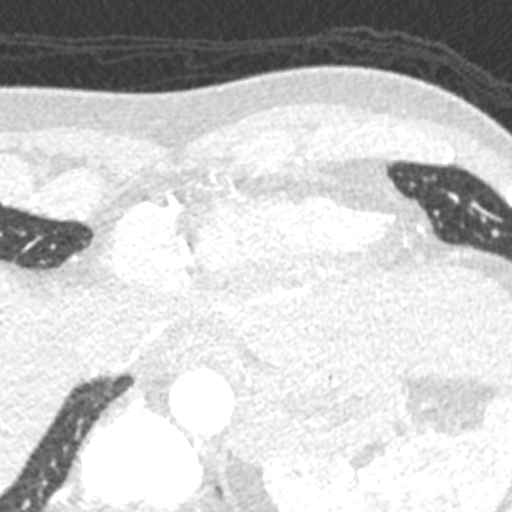
[im 149/445  vessel]
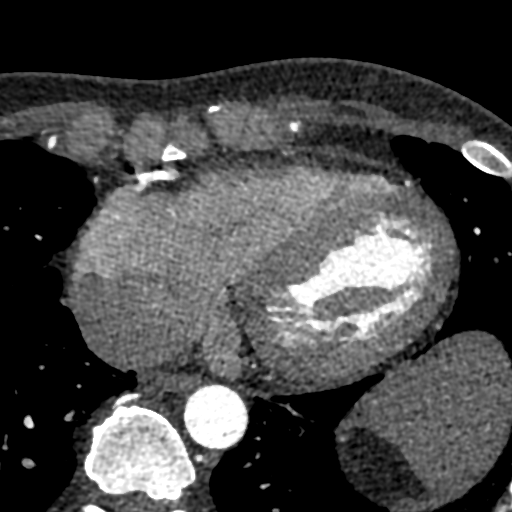
[im 223/445  vessel]
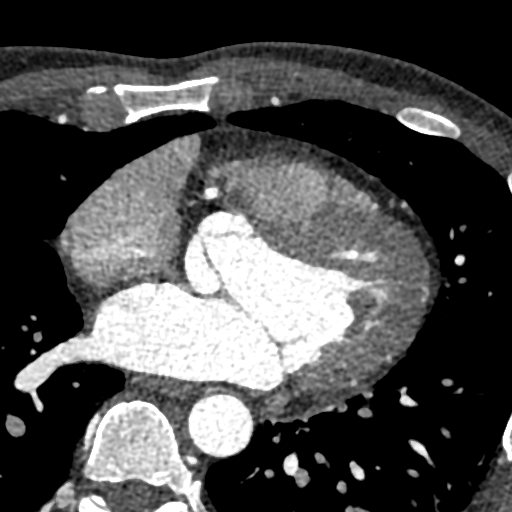
[im 297/445  vessel]
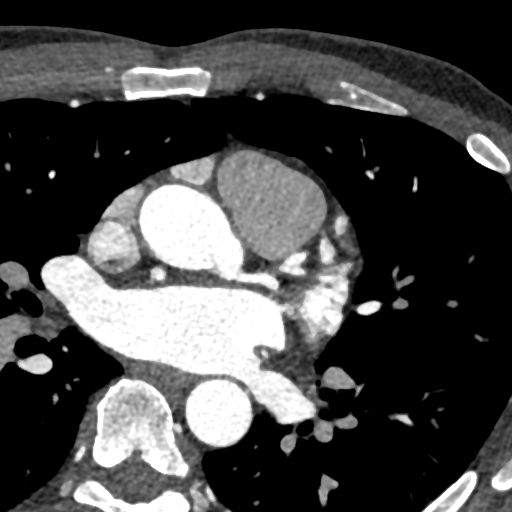
[im 371/445  vessel]
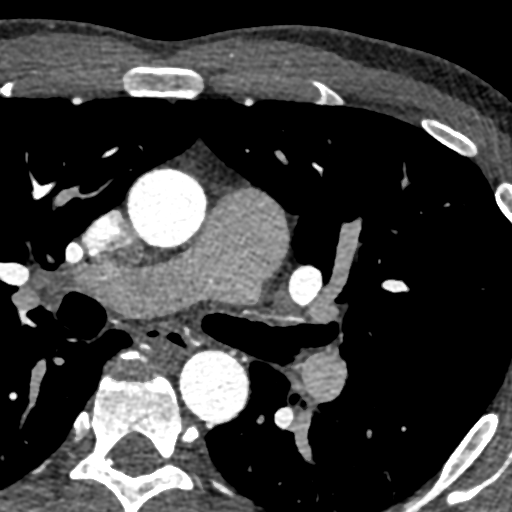
[im 371/445  lung]
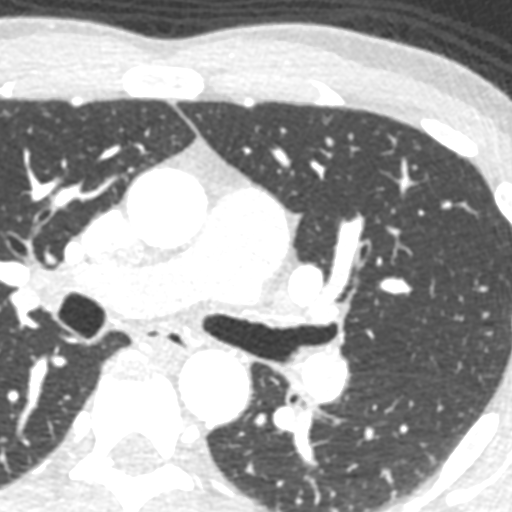

[Series 8: +300 ms · axial · 0.38mm/px · z∈[+1031,+1150]mm · 5 of 445 slices shown]
[im 75/445  vessel]
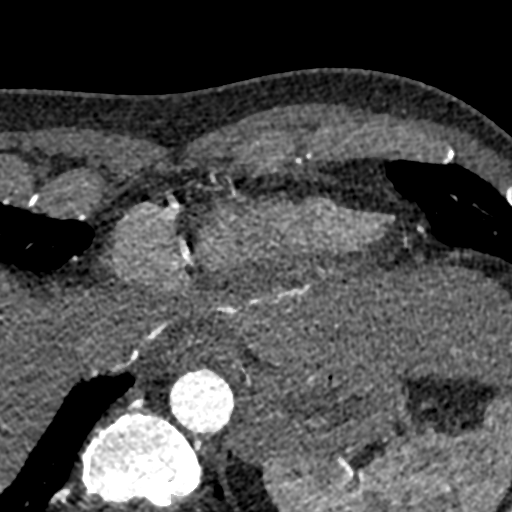
[im 149/445  vessel]
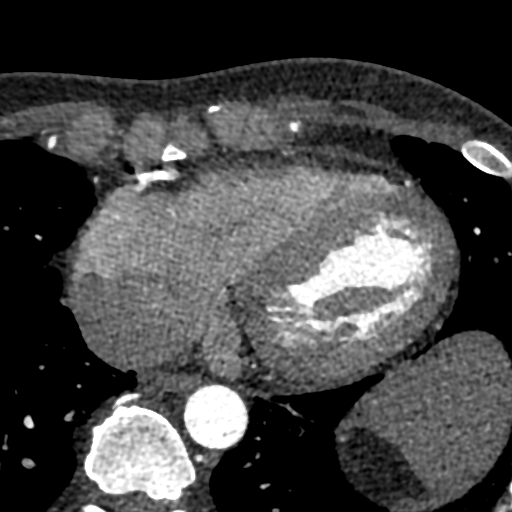
[im 223/445  vessel]
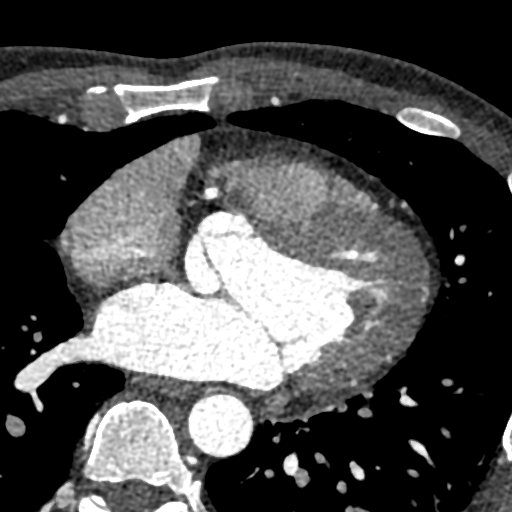
[im 297/445  vessel]
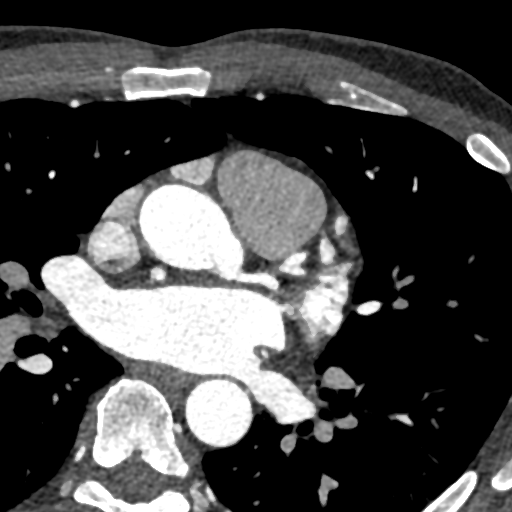
[im 371/445  vessel]
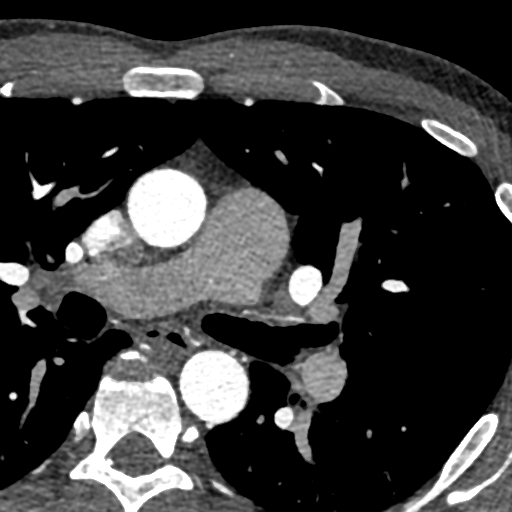

[10 of 20 positions shown; findings below may reference images not displayed]

FINDINGS: A 120 kV prospective scan was triggered in the descending thoracic
aorta at 111 HU's. Gantry rotation speed was 280 msecs and
collimation was .9 mm. No beta blockade and 0.4 NTG was given. The
3D data set was reconstructed in 5% intervals of the 60-80 % of the
R-R cycle. Diastolic phases were analyzed on a dedicated work
station using MPR, MIP and VRT modes. The patient received 80 cc of
contrast.

There is normal pulmonary vein drainage into the left atrium (2 on
the right and 2 on the left) with ostial measurements as follows:

RUPV:  24 x 20 mm

RLPV:  20 x 15 mm

LUPV:  23 x 13 mm

LLPV:  19 x 11 mm

The left atrial appendage is large with chicken wing morphology and
two lobes. Ostial size 28 x 15 mm and length 54 mm. There is no
thrombus in the left atrial appendage.

The esophagus runs in the left atrial midline and is not in the
proximity to any of the pulmonary veins.

Aorta:  Normal caliber.  No dissection or calcifications.

Aortic Valve:  Trileaflet.  No calcifications.

Coronary Arteries: Normal coronary origin. Right dominance. Trivial
calcified plaque in the RCA, otherwise normal coronaries.
IMPRESSION: 1. There is normal pulmonary vein drainage into the left atrium.

2. The left atrial appendage is large with chicken wing morphology
and two lobes. Ostial size 28 x 15 mm and length 54 mm. There is no
thrombus in the left atrial appendage.

3. The esophagus runs in the left atrial midline and is not in the
proximity to any of the pulmonary veins.

4. Normal coronary origin. Right dominance. Trivial calcified plaque
in the RCA, otherwise normal coronaries.

Yi Hsien Feby

EXAM:
OVER-READ INTERPRETATION  CT CHEST

The following report is an over-read performed by radiologist Dr.
Jismu Kostoo [REDACTED] on 03/15/2017. This over-read
does not include interpretation of cardiac or coronary anatomy or
pathology. The coronary CTA interpretation by the cardiologist is
attached.
FINDINGS: Cardiovascular: Heart is normal size. Visualized aorta is normal
caliber.

Mediastinum/Nodes: No adenopathy in the lower mediastinum or hila.

Lungs/Pleura: Visualized lungs clear.  No effusions.

Upper Abdomen: Imaging into the upper abdomen shows no acute
findings.

Musculoskeletal: Chest wall soft tissues are unremarkable. No acute
bony abnormality.
IMPRESSION: No acute or significant extracardiac abnormality.

## 2020-03-27 ENCOUNTER — Inpatient Hospital Stay: Payer: BC Managed Care – PPO

## 2020-03-27 ENCOUNTER — Other Ambulatory Visit: Payer: Self-pay

## 2020-03-27 ENCOUNTER — Inpatient Hospital Stay: Payer: BC Managed Care – PPO | Attending: Oncology | Admitting: Oncology

## 2020-03-27 VITALS — BP 129/79 | HR 72 | Temp 97.0°F | Resp 18 | Ht 74.0 in | Wt 169.8 lb

## 2020-03-27 DIAGNOSIS — D709 Neutropenia, unspecified: Secondary | ICD-10-CM | POA: Diagnosis not present

## 2020-03-27 LAB — CBC WITH DIFFERENTIAL (CANCER CENTER ONLY)
Abs Immature Granulocytes: 0.01 10*3/uL (ref 0.00–0.07)
Basophils Absolute: 0 10*3/uL (ref 0.0–0.1)
Basophils Relative: 1 %
Eosinophils Absolute: 0.1 10*3/uL (ref 0.0–0.5)
Eosinophils Relative: 2 %
HCT: 37.4 % — ABNORMAL LOW (ref 39.0–52.0)
Hemoglobin: 12.7 g/dL — ABNORMAL LOW (ref 13.0–17.0)
Immature Granulocytes: 0 %
Lymphocytes Relative: 48 %
Lymphs Abs: 2.2 10*3/uL (ref 0.7–4.0)
MCH: 31.9 pg (ref 26.0–34.0)
MCHC: 34 g/dL (ref 30.0–36.0)
MCV: 94 fL (ref 80.0–100.0)
Monocytes Absolute: 0.5 10*3/uL (ref 0.1–1.0)
Monocytes Relative: 11 %
Neutro Abs: 1.7 10*3/uL (ref 1.7–7.7)
Neutrophils Relative %: 38 %
Platelet Count: 157 10*3/uL (ref 150–400)
RBC: 3.98 MIL/uL — ABNORMAL LOW (ref 4.22–5.81)
RDW: 12.5 % (ref 11.5–15.5)
WBC Count: 4.6 10*3/uL (ref 4.0–10.5)
nRBC: 0 % (ref 0.0–0.2)

## 2020-03-27 NOTE — Progress Notes (Signed)
Hematology and Oncology Follow Up Visit  Johnathan Walker [id number] [birth year] 49 y.o. 03/27/2020 10:03 AM Little, Lennette Bihari MDLittle, Lennette Bihari, MD   Principle Diagnosis: 49 year old man with neutropenia diagnosed and 2018.  His recent white cell count showed absolute neutrophil count of 1300.  Etiology is related to benign fluctuating etiology.   Current therapy: Active surveillance.  Interim History: Johnathan Walker returns today for a follow-up evaluation.  Since the last visit, he reports no major changes in his health.  He denies any recent hospitalization or illnesses.  He denies any decline in his energy or performance status.  Formal status quality of life remains excellent.  He ran a half marathon in individual yesterday.     Medications: I have reviewed the patient's current medications.  Current Outpatient Medications  Medication Sig Dispense Refill  . diltiazem (CARDIZEM) 30 MG tablet TAKE 1 TABLET EVERY 4 HOURS AS NEEDED FOR AFIB HEART RATE >100 45 tablet 1  . losartan (COZAAR) 50 MG tablet Take 50 mg by mouth daily.   2  . niacin (SLO-NIACIN) 500 MG tablet Take 1,500 mg by mouth at bedtime.    . Omega-3 Fatty Acids (FISH OIL OMEGA-3 PO) Take 2 capsules by mouth.     No current facility-administered medications for this visit.     Allergies: No Known Allergies    Physical Exam: Blood pressure 129/79, pulse 72, temperature (!) 97 F (36.1 C), temperature source Tympanic, resp. rate 18, height 6' 2"  (1.88 m), weight 169 lb 12.8 oz (77 kg), SpO2 100 %.   ECOG: 0   General appearance: Comfortable appearing without any discomfort Head: Normocephalic without any trauma Oropharynx: Mucous membranes are moist and pink without any thrush or ulcers. Eyes: Pupils are equal and round reactive to light. Lymph nodes: No cervical, supraclavicular, inguinal or axillary lymphadenopathy.   Heart:regular rate and rhythm.  S1 and S2 without leg edema. Lung: Clear without any rhonchi or  wheezes.  No dullness to percussion. Abdomin: Soft, nontender, nondistended with good bowel sounds.  No hepatosplenomegaly. Musculoskeletal: No joint deformity or effusion.  Full range of motion noted. Neurological: No deficits noted on motor, sensory and deep tendon reflex exam. Skin: No petechial rash or dryness.  Appeared moist.     Lab Results: Lab Results  Component Value Date   WBC 5.3 03/07/2017   HGB 12.7 (L) 03/07/2017   HCT 35.8 (L) 03/07/2017   MCV 88 03/07/2017   PLT 194 03/07/2017     Chemistry      Component Value Date/Time   NA 142 03/07/2017 1627   K 4.4 03/07/2017 1627   CL 104 03/07/2017 1627   CO2 23 03/07/2017 1627   BUN 20 03/07/2017 1627   CREATININE 0.86 03/07/2017 1627      Component Value Date/Time   CALCIUM 9.5 03/07/2017 1627       Impression and Plan:  49 year old man with:  1.    Neutropenia noted in July 2018 with CBC obtained in March 2021 showed absolute neutrophil count 1300 and normal CBC otherwise.  Differential diagnosis was reviewed at this time.  Benign etiology remains the most likely cause at this time.  Other conditions such as myelodysplastic syndrome, myeloproliferative disorder and autoimmune diseases are considered less likely.  Laboratory data from today showed a white cell count of 4.6 with absolute neutrophil count of 1700 which is within normal range.  Based on these findings I see no signs of hematological disorder or any need for further work-up  2.    Anemia: His hemoglobin is 12.7 which is very close to normal range and no intervention is needed.  3.  Follow-up: I am happy to see him in the future as needed.   30  minutes were spent on this encounter.  The time was dedicated to reviewing his disease status, discussing laboratory data and future plan of care review.  Zola Button, MD 10/21/202110:03 AM

## 2020-06-17 ENCOUNTER — Encounter: Payer: Self-pay | Admitting: Cardiology

## 2020-06-17 ENCOUNTER — Ambulatory Visit (INDEPENDENT_AMBULATORY_CARE_PROVIDER_SITE_OTHER): Payer: BC Managed Care – PPO | Admitting: Cardiology

## 2020-06-17 ENCOUNTER — Other Ambulatory Visit: Payer: Self-pay

## 2020-06-17 VITALS — BP 130/68 | HR 72 | Ht 74.0 in | Wt 177.4 lb

## 2020-06-17 DIAGNOSIS — I480 Paroxysmal atrial fibrillation: Secondary | ICD-10-CM | POA: Diagnosis not present

## 2020-06-17 NOTE — Progress Notes (Signed)
Electrophysiology Office Note   Date:  06/17/2020   ID:  Demarri Elie, DOB 1971-05-22, MRN [id number]  PCP:  Hulan Fess, MD  Cardiologist: Nahser Primary Electrophysiologist:  Nicloe Frontera Meredith Leeds, MD    No chief complaint on file.    History of Present Illness: Johnathan Walker is a 50 y.o. male who is being seen today for the evaluation of atrial fibrillation at the request of Hulan Fess, MD. Presenting today for electrophysiology evaluation.   He was hospitalized June 2018 with new onset atrial fibrillation and rapid ventricular response. He was put on metoprolol. Echo showed a normal ejection fraction. Due to continued palpitations, he was started on flecainide. He presented to the hospital 02/18/2017 with an episode of syncope. ECG showed an atypical atrial flutter with heart rates to 40 to 50 bpm and variable AV block. He declined cardioversion in the emergency room and was started on Xarelto. He is now status post ablation 03/17/2017.  Today, denies symptoms of palpitations, chest pain, shortness of breath, orthopnea, PND, lower extremity edema, claudication, dizziness, presyncope, syncope, bleeding, or neurologic sequela. The patient is tolerating medications without difficulties. Since last being seen he has done well. He has no chest pain or shortness of breath. He is able to do all of his daily activities without restriction. He is planning to run a marathon and has been training for this. Despite his increased exercise, he has not had any atrial fibrillation. He also noted some pounding when drinking caffeine. He has cut back to twice a day which has improved his symptoms.   Past Medical History:  Diagnosis Date  . Atrial fibrillation with rapid ventricular response ([place]) 11/16/2016  . Essential hypertension 11/16/2016  . Hypercholesteremia   . Hypertension   . New onset atrial fibrillation ([place]) 11/15/2016   Past Surgical History:  Procedure Laterality Date  . ATRIAL  FIBRILLATION ABLATION  03/17/2017  . ATRIAL FIBRILLATION ABLATION N/A 03/17/2017   Procedure: Atrial Fibrillation Ablation;  Surgeon: Constance Haw, MD;  Location: [facility];  Service: Cardiovascular;  Laterality: N/A;     Current Outpatient Medications  Medication Sig Dispense Refill  . diltiazem (CARDIZEM) 30 MG tablet TAKE 1 TABLET EVERY 4 HOURS AS NEEDED FOR AFIB HEART RATE >100 45 tablet 1  . losartan (COZAAR) 100 MG tablet Take 50 mg by mouth daily.    . niacin (SLO-NIACIN) 500 MG tablet Take 1,500 mg by mouth at bedtime.    . Omega-3 Fatty Acids (FISH OIL OMEGA-3 PO) Take 2 capsules by mouth.     No current facility-administered medications for this visit.    Allergies:   Patient has no known allergies.   Social History:  The patient  reports that he has never smoked. He has never used smokeless tobacco. He reports that he does not drink alcohol and does not use drugs.   Family History:  The patient's family history includes Atrial fibrillation in his mother; Diabetes in his father; Hypertension in his father; Lung cancer in his father.   ROS:  Please see the history of present illness.   Otherwise, review of systems is positive for none.   All other systems are reviewed and negative.   PHYSICAL EXAM: VS:  BP 130/68   Pulse 72   Ht 6' 2"  (1.88 m)   Wt 177 lb 6.4 oz (80.5 kg)   SpO2 98%   BMI 22.78 kg/m  , BMI Body mass index is 22.78 kg/m. GEN: Well nourished, well developed,  in no acute distress  HEENT: normal  Neck: no JVD, carotid bruits, or masses Cardiac: RRR; no murmurs, rubs, or gallops,no edema  Respiratory:  clear to auscultation bilaterally, normal work of breathing GI: soft, nontender, nondistended, + BS MS: no deformity or atrophy  Skin: warm and dry Neuro:  Strength and sensation are intact Psych: euthymic mood, full affect  EKG:  EKG is ordered today. Personal review of the ekg ordered shows sinus rhythm  Recent Labs: 03/27/2020:  Hemoglobin 12.7; Platelet Count 157    Lipid Panel  No results found for: CHOL, TRIG, HDL, CHOLHDL, VLDL, LDLCALC, LDLDIRECT   Wt Readings from Last 3 Encounters:  06/17/20 177 lb 6.4 oz (80.5 kg)  03/27/20 169 lb 12.8 oz (77 kg)  09/26/19 178 lb 11.2 oz (81.1 kg)      Other studies Reviewed: Additional studies/ records that were reviewed today include: TTE 11/16/16, outside records - personally reviewed  Review of the above records today demonstrates:  - Left ventricle: The cavity size was normal. Systolic function was   normal. The estimated ejection fraction was in the range of 55%   to 60%. Wall motion was normal; there were no regional wall   motion abnormalities. Left ventricular diastolic function   parameters were normal. - Atrial septum: No defect or patent foramen ovale was identified.   ASSESSMENT AND PLAN:  1. Paroxysmal atrial fibrillation: Status post ablation 03/17/2017. CHA2DS2-VASc of 1 and thus not anticoagulated. He has remained in sinus rhythm. No changes.  2. Hypertension: Currently well controlled    Current medicines are reviewed at length with the patient today.   The patient does not have concerns regarding his medicines.  The following changes were made today: None  Labs/ tests ordered today include:  Orders Placed This Encounter  Procedures  . EKG 12-Lead     Disposition:   FU with Jadee Golebiewski 12 months  Signed, Troi Florendo Meredith Leeds, MD  06/17/2020 8:28 AM     [facility] [address] [place] [address] [phone] (office) [phone] (fax)

## 2020-10-26 DIAGNOSIS — M545 Low back pain, unspecified: Secondary | ICD-10-CM | POA: Diagnosis not present

## 2020-10-31 DIAGNOSIS — R3989 Other symptoms and signs involving the genitourinary system: Secondary | ICD-10-CM | POA: Diagnosis not present

## 2020-10-31 DIAGNOSIS — R310 Gross hematuria: Secondary | ICD-10-CM | POA: Diagnosis not present

## 2020-11-18 ENCOUNTER — Other Ambulatory Visit: Payer: Self-pay

## 2020-11-18 ENCOUNTER — Encounter: Payer: Self-pay | Admitting: Family Medicine

## 2020-11-18 ENCOUNTER — Ambulatory Visit: Payer: BC Managed Care – PPO | Admitting: Family Medicine

## 2020-11-18 VITALS — BP 132/72 | HR 68 | Temp 98.7°F | Ht 74.0 in | Wt 177.0 lb

## 2020-11-18 DIAGNOSIS — I480 Paroxysmal atrial fibrillation: Secondary | ICD-10-CM

## 2020-11-18 DIAGNOSIS — I10 Essential (primary) hypertension: Secondary | ICD-10-CM | POA: Diagnosis not present

## 2020-11-18 DIAGNOSIS — G8929 Other chronic pain: Secondary | ICD-10-CM

## 2020-11-18 DIAGNOSIS — Z1211 Encounter for screening for malignant neoplasm of colon: Secondary | ICD-10-CM

## 2020-11-18 DIAGNOSIS — M545 Low back pain, unspecified: Secondary | ICD-10-CM

## 2020-11-18 DIAGNOSIS — L409 Psoriasis, unspecified: Secondary | ICD-10-CM | POA: Diagnosis not present

## 2020-11-18 MED ORDER — CLOBETASOL PROPIONATE 0.05 % EX SOLN: 1.0000 "application " | Freq: Two times a day (BID) | CUTANEOUS | 0 refills | Status: DC

## 2020-11-18 NOTE — Progress Notes (Signed)
Johnathan Walker is a 50 y.o. male who presents today for an office visit.  He is a new patient.   Assessment/Plan:  Chronic Problems Addressed Today: Psoriasis We will refill the topical clobetasol.  He uses a few times monthly as needed for flareups.  Chronic low back Walker Follows with orthopedics.  Occasionally needs prednisone as needed for flares.  Muscle relaxers usually do not work.  AF (atrial fibrillation) ([place]) s/p ablation Rate controlled today.  Essential hypertension At goal on losartan 50 mg daily.  Preventative health care Check cologuard.  He will come back soon for CPE.     Subjective:  HPI:  See A/P for status of chronic conditions.  ROS: Per HPI, otherwise a complete review of systems was negative.   PMH:  The following were reviewed and entered/updated in epic: Past Medical History:  Diagnosis Date   Atrial fibrillation with rapid ventricular response ([place]) 11/16/2016   Essential hypertension 11/16/2016   Hypercholesteremia    Hypertension    New onset atrial fibrillation ([place]) 11/15/2016   Patient Active Problem List   Diagnosis Date Noted   Chronic low back Walker 11/18/2020   Psoriasis 11/18/2020   AF (atrial fibrillation) ([place]) s/p ablation 03/17/2017   Essential hypertension 11/16/2016   Past Surgical History:  Procedure Laterality Date   ATRIAL FIBRILLATION ABLATION  03/17/2017   ATRIAL FIBRILLATION ABLATION N/A 03/17/2017   Procedure: Atrial Fibrillation Ablation;  Surgeon: Constance Haw, MD;  Location: [facility];  Service: Cardiovascular;  Laterality: N/A;    Family History  Problem Relation Age of Onset   Atrial fibrillation Mother    Diabetes Father    Hypertension Father    Lung cancer Father     Medications- reviewed and updated Current Outpatient Medications  Medication Sig Dispense Refill   clobetasol (TEMOVATE) 0.05 % external solution Apply 1 application topically 2 (two) times daily. 50 mL 0    Glucosamine 500 MG CAPS Take by mouth.     losartan (COZAAR) 100 MG tablet Take 50 mg by mouth daily.     niacin (SLO-NIACIN) 500 MG tablet Take 1,500 mg by mouth at bedtime.     Omega-3 Fatty Acids (FISH OIL OMEGA-3 PO) Take 2 capsules by mouth.     No current facility-administered medications for this visit.    Allergies-reviewed and updated No Known Allergies  Social History   Socioeconomic History   Marital status: Married    Spouse name: Not on file   Number of children: Not on file   Years of education: Not on file   Highest education level: Not on file  Occupational History   Not on file  Tobacco Use   Smoking status: Never   Smokeless tobacco: Never  Vaping Use   Vaping Use: Never used  Substance and Sexual Activity   Alcohol use: No   Drug use: No   Sexual activity: Not on file  Other Topics Concern   Not on file  Social History Narrative   Not on file   Social Determinants of Health   Financial Resource Strain: Not on file  Food Insecurity: Not on file  Transportation Needs: Not on file  Physical Activity: Not on file  Stress: Not on file  Social Connections: Not on file          Objective:  Physical Exam: BP 132/72   Pulse 68   Temp 98.7 F (37.1 C) (Temporal)   Ht 6' 2"  (1.88 m)  Wt 177 lb (80.3 kg)   SpO2 98%   BMI 22.73 kg/m   Gen: No acute distress, resting comfortably CV: Regular rate and rhythm with no murmurs appreciated Pulm: Normal work of breathing, clear to auscultation bilaterally with no crackles, wheezes, or rhonchi Neuro: Grossly normal, moves all extremities Psych: Normal affect and thought content      Johnathan Tiegs M. Jerline Pain, MD 11/18/2020 3:54 PM

## 2020-11-18 NOTE — Patient Instructions (Signed)
It was very nice to see you today!  I will refill your clobetasol solution.  We will also order cologuard.  Please come back in a few months for your annual checkup with labs.  Please come back to see me sooner if needed.  Take care, Dr Jerline Pain  PLEASE NOTE:  If you had any lab tests please let us know if you have not heard back within a few days. You may see your results on mychart before we have a chance to review them but we will give you a call once they are reviewed by [place]. If we ordered any referrals today, please let us know if you have not heard from their office within the next week.   Please try these tips to maintain a healthy lifestyle:  Eat at least 3 REAL meals and 1-2 snacks per day.  Aim for no more than 5 hours between eating.  If you eat breakfast, please do so within one hour of getting up.   Each meal should contain half fruits/vegetables, one quarter protein, and one quarter carbs (no bigger than a computer mouse)  Cut down on sweet beverages. This includes juice, soda, and sweet tea.   Drink at least 1 glass of water with each meal and aim for at least 8 glasses per day  Exercise at least 150 minutes every week.

## 2020-11-18 NOTE — Assessment & Plan Note (Signed)
Follows with orthopedics.  Occasionally needs prednisone as needed for flares.  Muscle relaxers usually do not work.

## 2020-11-18 NOTE — Assessment & Plan Note (Signed)
At goal on losartan 50 mg daily.

## 2020-11-18 NOTE — Assessment & Plan Note (Signed)
We will refill the topical clobetasol.  He uses a few times monthly as needed for flareups.

## 2020-11-18 NOTE — Assessment & Plan Note (Signed)
Rate controlled today.

## 2020-12-21 LAB — COLOGUARD: Cologuard: NEGATIVE

## 2020-12-26 DIAGNOSIS — M419 Scoliosis, unspecified: Secondary | ICD-10-CM | POA: Diagnosis not present

## 2020-12-26 DIAGNOSIS — M5136 Other intervertebral disc degeneration, lumbar region: Secondary | ICD-10-CM | POA: Diagnosis not present

## 2021-01-15 ENCOUNTER — Encounter: Payer: Self-pay | Admitting: Family Medicine

## 2021-01-22 DIAGNOSIS — Z1211 Encounter for screening for malignant neoplasm of colon: Secondary | ICD-10-CM | POA: Diagnosis not present

## 2021-01-22 LAB — COLOGUARD: Cologuard: NEGATIVE

## 2021-02-01 LAB — COLOGUARD: Cologuard: NEGATIVE

## 2021-02-03 ENCOUNTER — Encounter: Payer: Self-pay | Admitting: Family Medicine

## 2021-04-13 ENCOUNTER — Telehealth: Payer: Self-pay

## 2021-04-13 ENCOUNTER — Other Ambulatory Visit: Payer: Self-pay | Admitting: *Deleted

## 2021-04-13 DIAGNOSIS — Z0000 Encounter for general adult medical examination without abnormal findings: Secondary | ICD-10-CM

## 2021-04-13 NOTE — Telephone Encounter (Signed)
Ok to placed lab?

## 2021-04-13 NOTE — Telephone Encounter (Signed)
Lab order Placed  LVM to patient to schedule appt with lab

## 2021-04-13 NOTE — Telephone Encounter (Signed)
Ok to order CBC, CMET, TSH, lipid panel, A1c, and PSA.  Algis Greenhouse. Jerline Pain, MD 04/13/2021 3:23 PM

## 2021-04-13 NOTE — Telephone Encounter (Signed)
Patient called in and wanted to see if Dr. Jerline Pain would put in his lab work before his CPE on 04/28/21.

## 2021-04-23 ENCOUNTER — Other Ambulatory Visit (INDEPENDENT_AMBULATORY_CARE_PROVIDER_SITE_OTHER): Payer: BC Managed Care – PPO

## 2021-04-23 ENCOUNTER — Other Ambulatory Visit: Payer: Self-pay

## 2021-04-23 DIAGNOSIS — Z0000 Encounter for general adult medical examination without abnormal findings: Secondary | ICD-10-CM

## 2021-04-23 LAB — COMPREHENSIVE METABOLIC PANEL
ALT: 32 U/L (ref 0–53)
AST: 24 U/L (ref 0–37)
Albumin: 4.4 g/dL (ref 3.5–5.2)
Alkaline Phosphatase: 57 U/L (ref 39–117)
BUN: 15 mg/dL (ref 6–23)
CO2: 30 mEq/L (ref 19–32)
Calcium: 9.1 mg/dL (ref 8.4–10.5)
Chloride: 104 mEq/L (ref 96–112)
Creatinine, Ser: 0.84 mg/dL (ref 0.40–1.50)
GFR: 102.24 mL/min (ref >60.00)
Glucose, Bld: 88 mg/dL (ref 70–99)
Potassium: 4.3 mEq/L (ref 3.5–5.1)
Sodium: 140 mEq/L (ref 135–145)
Total Bilirubin: 1.5 mg/dL — ABNORMAL HIGH (ref 0.2–1.2)
Total Protein: 6.3 g/dL (ref 6.0–8.3)

## 2021-04-23 LAB — HEMOGLOBIN A1C: Hgb A1c MFr Bld: 5.3 % (ref 4.6–6.5)

## 2021-04-23 LAB — LIPID PANEL
Cholesterol: 147 mg/dL (ref 0–200)
HDL: 62.5 mg/dL (ref >39.00)
LDL Cholesterol: 76 mg/dL (ref 0–99)
NonHDL: 84.3
Total CHOL/HDL Ratio: 2
Triglycerides: 41 mg/dL (ref 0.0–149.0)
VLDL: 8.2 mg/dL (ref 0.0–40.0)

## 2021-04-23 LAB — CBC
HCT: 40.6 % (ref 39.0–52.0)
Hemoglobin: 13.7 g/dL (ref 13.0–17.0)
MCHC: 33.8 g/dL (ref 30.0–36.0)
MCV: 94.9 fl (ref 78.0–100.0)
Platelets: 168 10*3/uL (ref 150.0–400.0)
RBC: 4.28 Mil/uL (ref 4.22–5.81)
RDW: 13.2 % (ref 11.5–15.5)
WBC: 3.9 10*3/uL — ABNORMAL LOW (ref 4.0–10.5)

## 2021-04-23 LAB — PSA: PSA: 0.93 ng/mL (ref 0.10–4.00)

## 2021-04-23 LAB — TSH: TSH: 1.37 u[IU]/mL (ref 0.35–5.50)

## 2021-04-23 NOTE — Progress Notes (Signed)
Please inform patient of the following:  LAbs are all stable. We can discuss more at his upcoming office visit.  Algis Greenhouse. Jerline Pain, MD 04/23/2021 3:13 PM

## 2021-04-28 ENCOUNTER — Ambulatory Visit (INDEPENDENT_AMBULATORY_CARE_PROVIDER_SITE_OTHER): Payer: BC Managed Care – PPO | Admitting: Family Medicine

## 2021-04-28 ENCOUNTER — Encounter: Payer: Self-pay | Admitting: Family Medicine

## 2021-04-28 ENCOUNTER — Other Ambulatory Visit: Payer: Self-pay

## 2021-04-28 VITALS — BP 136/72 | HR 67 | Temp 98.4°F | Ht 74.0 in | Wt 171.0 lb

## 2021-04-28 DIAGNOSIS — Z0001 Encounter for general adult medical examination with abnormal findings: Secondary | ICD-10-CM | POA: Diagnosis not present

## 2021-04-28 DIAGNOSIS — I10 Essential (primary) hypertension: Secondary | ICD-10-CM | POA: Diagnosis not present

## 2021-04-28 DIAGNOSIS — L409 Psoriasis, unspecified: Secondary | ICD-10-CM | POA: Diagnosis not present

## 2021-04-28 DIAGNOSIS — I480 Paroxysmal atrial fibrillation: Secondary | ICD-10-CM

## 2021-04-28 NOTE — Assessment & Plan Note (Signed)
Follows with cardiology.  Regular rate and rhythm today.

## 2021-04-28 NOTE — Progress Notes (Signed)
Chief Complaint:  Johnathan Walker is a 50 y.o. male who presents today for his annual comprehensive physical exam.    Assessment/Plan:  Chronic Problems Addressed Today: Psoriasis Stable topical clobetasol as needed.  AF (atrial fibrillation) [facility]) s/p ablation Follows with cardiology.  Regular rate and rhythm today.  Essential hypertension At goal on losartan 50 mg daily.  Preventative Healthcare: Declined flu vaccine.  Cologuard earlier this year was normal.   Patient Counseling(The following topics were reviewed and/or handout was given):  -Nutrition: Stressed importance of moderation in sodium/caffeine intake, saturated fat and cholesterol, caloric balance, sufficient intake of fresh fruits, vegetables, and fiber.  -Stressed the importance of regular exercise.   -Substance Abuse: Discussed cessation/primary prevention of tobacco, alcohol, or other drug use; driving or other dangerous activities under the influence; availability of treatment for abuse.   -Injury prevention: Discussed safety belts, safety helmets, smoke detector, smoking near bedding or upholstery.   -Sexuality: Discussed sexually transmitted diseases, partner selection, use of condoms, avoidance of unintended pregnancy and contraceptive alternatives.   -Dental health: Discussed importance of regular tooth brushing, flossing, and dental visits.  -Health maintenance and immunizations reviewed. Please refer to Health maintenance section.  Return to care in 1 year for next preventative visit.     Subjective:  HPI:  He has no acute complaints today. See A/p for status of chronic conditions.   He noticed few Afib palpitation. He notes this was due to increased coffee intake. He is following up with his cardiologist in january.  Lifestyle Diet: None specific. Exercise: Likes to run.  Depression screen PHQ 2/9 04/28/2021  Decreased Interest 0  Down, Depressed, Hopeless 0  PHQ - 2 Score 0    Health  Maintenance Due  Topic Date Due   Hepatitis C Screening  Never done     ROS: Per HPI, otherwise a complete review of systems was negative.   PMH:  The following were reviewed and entered/updated in epic: Past Medical History:  Diagnosis Date   Atrial fibrillation with rapid ventricular response ([place]) 11/16/2016   Essential hypertension 11/16/2016   Hypercholesteremia    Hypertension    New onset atrial fibrillation ([place]) 11/15/2016   Patient Active Problem List   Diagnosis Date Noted   Chronic low back pain 11/18/2020   Psoriasis 11/18/2020   AF (atrial fibrillation) ([place]) s/p ablation 03/17/2017   Essential hypertension 11/16/2016   Past Surgical History:  Procedure Laterality Date   ATRIAL FIBRILLATION ABLATION  03/17/2017   ATRIAL FIBRILLATION ABLATION N/A 03/17/2017   Procedure: Atrial Fibrillation Ablation;  Surgeon: Constance Haw, MD;  Location: [facility];  Service: Cardiovascular;  Laterality: N/A;    Family History  Problem Relation Age of Onset   Atrial fibrillation Mother    Diabetes Father    Hypertension Father    Lung cancer Father     Medications- reviewed and updated Current Outpatient Medications  Medication Sig Dispense Refill   clobetasol (TEMOVATE) 0.05 % external solution Apply 1 application topically 2 (two) times daily. 50 mL 0   Glucosamine 500 MG CAPS Take by mouth.     losartan (COZAAR) 100 MG tablet Take 50 mg by mouth daily.     niacin (SLO-NIACIN) 500 MG tablet Take 1,500 mg by mouth at bedtime.     Omega-3 Fatty Acids (FISH OIL OMEGA-3 PO) Take 2 capsules by mouth.     No current facility-administered medications for this visit.    Allergies-reviewed and updated No Known Allergies  Social History   Socioeconomic History   Marital status: Married    Spouse name: Not on file   Number of children: Not on file   Years of education: Not on file   Highest education level: Not on file  Occupational History   Not on  file  Tobacco Use   Smoking status: Never   Smokeless tobacco: Never  Vaping Use   Vaping Use: Never used  Substance and Sexual Activity   Alcohol use: No   Drug use: No   Sexual activity: Not on file  Other Topics Concern   Not on file  Social History Narrative   Not on file   Social Determinants of Health   Financial Resource Strain: Not on file  Food Insecurity: Not on file  Transportation Needs: Not on file  Physical Activity: Not on file  Stress: Not on file  Social Connections: Not on file        Objective:  Physical Exam: BP 136/72   Pulse 67   Temp 98.4 F (36.9 C) (Temporal)   Ht 6' 2"  (1.88 m)   Wt 171 lb (77.6 kg)   SpO2 100%   BMI 21.96 kg/m   Body mass index is 21.96 kg/m. Wt Readings from Last 3 Encounters:  04/28/21 171 lb (77.6 kg)  11/18/20 177 lb (80.3 kg)  06/17/20 177 lb 6.4 oz (80.5 kg)   Gen: NAD, resting comfortably HEENT: TMs normal bilaterally. OP clear. No thyromegaly noted.  CV: RRR with no murmurs appreciated Pulm: NWOB, CTAB with no crackles, wheezes, or rhonchi GI: Normal bowel sounds present. Soft, Nontender, Nondistended. MSK: no edema, cyanosis, or clubbing noted Skin: warm, dry Neuro: CN2-12 grossly intact. Strength 5/5 in upper and lower extremities. Reflexes symmetric and intact bilaterally.  Psych: Normal affect and thought content      I,Savera Zaman,acting as a scribe for Dimas Chyle, MD.,have documented all relevant documentation on the behalf of Dimas Chyle, MD,as directed by  Dimas Chyle, MD while in the presence of Dimas Chyle, MD.   I, Dimas Chyle, MD, have reviewed all documentation for this visit. The documentation on 04/28/21 for the exam, diagnosis, procedures, and orders are all accurate and complete.  Algis Greenhouse. Jerline Pain, MD 04/28/2021 11:49 AM

## 2021-04-28 NOTE — Patient Instructions (Signed)
It was very nice to see you today!  No changes today!  Keep up the good work!  We will see back in 1 year for your next physical.  Come back sooner if needed.  Take care, Dr Jerline Pain  PLEASE NOTE:  If you had any lab tests please let us know if you have not heard back within a few days. You may see your results on mychart before we have a chance to review them but we will give you a call once they are reviewed by [place]. If we ordered any referrals today, please let us know if you have not heard from their office within the next week.   Please try these tips to maintain a healthy lifestyle:  Eat at least 3 REAL meals and 1-2 snacks per day.  Aim for no more than 5 hours between eating.  If you eat breakfast, please do so within one hour of getting up.   Each meal should contain half fruits/vegetables, one quarter protein, and one quarter carbs (no bigger than a computer mouse)  Cut down on sweet beverages. This includes juice, soda, and sweet tea.   Drink at least 1 glass of water with each meal and aim for at least 8 glasses per day  Exercise at least 150 minutes every week.    Preventive Care 44-56 Years Old, Male Preventive care refers to lifestyle choices and visits with your health care provider that can promote health and wellness. Preventive care visits are also called wellness exams. What can I expect for my preventive care visit? Counseling During your preventive care visit, your health care provider may ask about your: Medical history, including: Past medical problems. Family medical history. Current health, including: Emotional well-being. Home life and relationship well-being. Sexual activity. Lifestyle, including: Alcohol, nicotine or tobacco, and drug use. Access to firearms. Diet, exercise, and sleep habits. Safety issues such as seatbelt and bike helmet use. Sunscreen use. Work and work Statistician. Physical exam Your health care provider will check  your: Height and weight. These may be used to calculate your BMI (body mass index). BMI is a measurement that tells if you are at a healthy weight. Waist circumference. This measures the distance around your waistline. This measurement also tells if you are at a healthy weight and may help predict your risk of certain diseases, such as type 2 diabetes and high blood pressure. Heart rate and blood pressure. Body temperature. Skin for abnormal spots. What immunizations do I need? Vaccines are usually given at various ages, according to a schedule. Your health care provider will recommend vaccines for you based on your age, medical history, and lifestyle or other factors, such as travel or where you work. What tests do I need? Screening Your health care provider may recommend screening tests for certain conditions. This may include: Lipid and cholesterol levels. Diabetes screening. This is done by checking your blood sugar (glucose) after you have not eaten for a while (fasting). Hepatitis B test. Hepatitis C test. HIV (human immunodeficiency virus) test. STI (sexually transmitted infection) testing, if you are at risk. Lung cancer screening. Prostate cancer screening. Colorectal cancer screening. Talk with your health care provider about your test results, treatment options, and if necessary, the need for more tests. Follow these instructions at home: Eating and drinking  Eat a diet that includes fresh fruits and vegetables, whole grains, lean protein, and low-fat dairy products. Take vitamin and mineral supplements as recommended by your health care provider. Do not  drink alcohol if your health care provider tells you not to drink. If you drink alcohol: Limit how much you have to 0-2 drinks a day. Know how much alcohol is in your drink. In the [place] one drink equals one 12 oz bottle of beer (355 mL), one 5 oz glass of wine (148 mL), or one 1 oz glass of hard liquor (44  mL). Lifestyle Brush your teeth every morning and night with fluoride toothpaste. Floss one time each day. Exercise for at least 30 minutes 5 or more days each week. Do not use any products that contain nicotine or tobacco. These products include cigarettes, chewing tobacco, and vaping devices, such as e-cigarettes. If you need help quitting, ask your health care provider. Do not use drugs. If you are sexually active, practice safe sex. Use a condom or other form of protection to prevent STIs. Take aspirin only as told by your health care provider. Make sure that you understand how much to take and what form to take. Work with your health care provider to find out whether it is safe and beneficial for you to take aspirin daily. Find healthy ways to manage stress, such as: Meditation, yoga, or listening to music. Journaling. Talking to a trusted person. Spending time with friends and family. Minimize exposure to UV radiation to reduce your risk of skin cancer. Safety Always wear your seat belt while driving or riding in a vehicle. Do not drive: If you have been drinking alcohol. Do not ride with someone who has been drinking. When you are tired or distracted. While texting. If you have been using any mind-altering substances or drugs. Wear a helmet and other protective equipment during sports activities. If you have firearms in your house, make sure you follow all gun safety procedures. What's next? Go to your health care provider once a year for an annual wellness visit. Ask your health care provider how often you should have your eyes and teeth checked. Stay up to date on all vaccines. This information is not intended to replace advice given to you by your health care provider. Make sure you discuss any questions you have with your health care provider. Document Revised: 11/19/2020 Document Reviewed: 11/19/2020 Elsevier Patient Education  [place].

## 2021-04-28 NOTE — Assessment & Plan Note (Signed)
Stable topical clobetasol as needed.

## 2021-04-28 NOTE — Assessment & Plan Note (Signed)
At goal on losartan 50 mg daily.

## 2021-09-22 ENCOUNTER — Encounter: Payer: Self-pay | Admitting: Cardiology

## 2021-09-22 ENCOUNTER — Ambulatory Visit: Payer: BC Managed Care – PPO | Admitting: Cardiology

## 2021-09-22 VITALS — BP 122/82 | HR 60 | Ht 74.0 in | Wt 176.6 lb

## 2021-09-22 DIAGNOSIS — I480 Paroxysmal atrial fibrillation: Secondary | ICD-10-CM | POA: Diagnosis not present

## 2021-09-22 NOTE — Patient Instructions (Signed)
Medication Instructions:  ?Your physician recommends that you continue on your current medications as directed. Please refer to the Current Medication list given to you today. ? ?*If you need a refill on your cardiac medications before your next appointment, please call your pharmacy* ? ? ?Lab Work: ?None ordered ? ? ?Testing/Procedures: ?None ordered ? ? ?Follow-Up: ?At [facility], you and your health needs are our priority.  As part of our continuing mission to provide you with exceptional heart care, we have created designated Provider Care Teams.  These Care Teams include your primary Cardiologist (physician) and Advanced Practice Providers (APPs -  Physician Assistants and Nurse Practitioners) who all work together to provide you with the care you need, when you need it. ? ?Your next appointment:   ?1 year(s) ? ?The format for your next appointment:   ?In Person ? ?Provider:   ?You will see one of the following Advanced Practice Providers on your designated Care Team:   ?Tommye Standard, PA-C ?Legrand Como "Jonni Sanger" [place], PA-C ? ? ? ? ?Thank you for choosing [facility]!! ? ? ?Trinidad Curet, RN ?[phone] ? ?

## 2021-09-22 NOTE — Progress Notes (Signed)
? ?Electrophysiology Office Note ? ? ?Date:  09/22/2021  ? ?ID:  Whitney Muse, DOB 03-Oct-1970, MRN [id number] ? ?PCP:  Vivi Barrack, MD  ?Cardiologist: [place] ?Primary Electrophysiologist:  Albino Bufford Meredith Leeds, MD   ? ?No chief complaint on file. ? ?  ?History of Present Illness: ?Johnathan Walker is a 51 y.o. male who is being seen today for the evaluation of atrial fibrillation at the request of Vivi Barrack, MD. Presenting today for electrophysiology evaluation.  ? ?He was hospitalized in 2018 with new onset atrial fibrillation and rapid ventricular response.  He was started on metoprolol.  Echo showed a normal ejection fraction.  He continued to have palpitations and was started on flecainide.  He presented to the hospital 02/18/2017 with an episode of syncope.  ECG showed atypical atrial flutter with rates of 40-50 and variable AV block.  He declined cardioversion in the emergency room.  He is now status post ablation 03/17/2017. ? ?Today, denies symptoms of palpitations, chest pain, shortness of breath, orthopnea, PND, lower extremity edema, claudication, dizziness, presyncope, syncope, bleeding, or neurologic sequela. The patient is tolerating medications without difficulties.  Since being seen he has done well.  He is noted no further episodes of atrial fibrillation.  He has occasional palpitations that last up to 30 seconds at a time but nothing that is prolonged.  He is overall quite happy with his control.  He is continuing to run for exercise. ? ? ?Past Medical History:  ?Diagnosis Date  ? Atrial fibrillation with rapid ventricular response ([place]) 11/16/2016  ? Essential hypertension 11/16/2016  ? Hypercholesteremia   ? Hypertension   ? New onset atrial fibrillation ([place]) 11/15/2016  ? ?Past Surgical History:  ?Procedure Laterality Date  ? ATRIAL FIBRILLATION ABLATION  03/17/2017  ? ATRIAL FIBRILLATION ABLATION N/A 03/17/2017  ? Procedure: Atrial Fibrillation Ablation;  Surgeon: Constance Haw, MD;   Location: [place] [facility];  Service: Cardiovascular;  Laterality: N/A;  ? ? ? ?Current Outpatient Medications  ?Medication Sig Dispense Refill  ? Glucosamine 500 MG CAPS Take by mouth.    ? losartan (COZAAR) 100 MG tablet Take 50 mg by mouth daily.    ? niacin (SLO-NIACIN) 500 MG tablet Take 1,500 mg by mouth at bedtime.    ? Omega-3 Fatty Acids (FISH OIL OMEGA-3 PO) Take 2 capsules by mouth.    ? ?No current facility-administered medications for this visit.  ? ? ?Allergies:   Patient has no known allergies.  ? ?Social History:  The patient  reports that he has never smoked. He has never used smokeless tobacco. He reports that he does not drink alcohol and does not use drugs.  ? ?Family History:  The patient's family history includes Atrial fibrillation in his mother; Diabetes in his father; Hypertension in his father; Lung cancer in his father.  ? ?ROS:  Please see the history of present illness.   Otherwise, review of systems is positive for none.   All other systems are reviewed and negative.  ? ?PHYSICAL EXAM: ?VS:  BP 122/82   Pulse 60   Ht 6' 2"  (1.88 m)   Wt 176 lb 9.6 oz (80.1 kg)   SpO2 96%   BMI 22.67 kg/m?  , BMI Body mass index is 22.67 kg/m?. ?GEN: Well nourished, well developed, in no acute distress  ?HEENT: normal  ?Neck: no JVD, carotid bruits, or masses ?Cardiac: RRR; no murmurs, rubs, or gallops,no edema  ?Respiratory:  clear to auscultation bilaterally, normal work  of breathing ?GI: soft, nontender, nondistended, + BS ?MS: no deformity or atrophy  ?Skin: warm and dry ?Neuro:  Strength and sensation are intact ?Psych: euthymic mood, full affect ? ?EKG:  EKG is ordered today. ?Personal review of the ekg ordered shows sinus rhythm, rate 60 ? ?Recent Labs: ?04/23/2021: ALT 32; BUN 15; Creatinine, Ser 0.84; Hemoglobin 13.7; Platelets 168.0; Potassium 4.3; Sodium 140; TSH 1.37  ? ? ?Lipid Panel  ?   ?Component Value Date/Time  ? CHOL 147 04/23/2021 0823  ? TRIG 41.0 04/23/2021 0823  ? HDL 62.50  04/23/2021 0823  ? CHOLHDL 2 04/23/2021 0823  ? VLDL 8.2 04/23/2021 0823  ? [place] 76 04/23/2021 0823  ? ? ? ?Wt Readings from Last 3 Encounters:  ?09/22/21 176 lb 9.6 oz (80.1 kg)  ?04/28/21 171 lb (77.6 kg)  ?11/18/20 177 lb (80.3 kg)  ?  ? ? ?Other studies Reviewed: ?Additional studies/ records that were reviewed today include: TTE 11/16/16, outside records - personally reviewed  ?Review of the above records today demonstrates:  ?- Left ventricle: The cavity size was normal. Systolic function was ?  normal. The estimated ejection fraction was in the range of 55% ?  to 60%. Wall motion was normal; there were no regional wall ?  motion abnormalities. Left ventricular diastolic function ?  parameters were normal. ?- Atrial septum: No defect or patent foramen ovale was identified. ? ? ?ASSESSMENT AND PLAN: ? ?1.  Paroxysmal atrial fibrillation: Status post ablation 03/17/2017.  CHA2DS2-VASc and 1 and thus not anticoagulated.  Had no further episodes.  He is overall happy with his control.  No changes. ? ?2.  Hypertension: Currently well controlled ? ?Current medicines are reviewed at length with the patient today.   ?The patient does not have concerns regarding his medicines.  The following changes were made today: None ? ?Labs/ tests ordered today include:  ?Orders Placed This Encounter  ?Procedures  ? EKG 12-Lead  ? ? ? ?Disposition:   FU 12 months ? ?Signed, ?Tejon Gracie Meredith Leeds, MD  ?09/22/2021 9:43 AM    ? ?[facility] ?[address] ?[address] ?[place] [address] ?[phone] (office) ?[phone] (fax) ?

## 2022-03-01 ENCOUNTER — Encounter: Payer: Self-pay | Admitting: *Deleted

## 2022-03-17 ENCOUNTER — Ambulatory Visit: Payer: BC Managed Care – PPO | Admitting: Family Medicine

## 2022-03-17 ENCOUNTER — Ambulatory Visit: Payer: Self-pay

## 2022-03-17 ENCOUNTER — Encounter: Payer: Self-pay | Admitting: Family Medicine

## 2022-03-17 VITALS — BP 126/70 | Ht 74.0 in | Wt 170.0 lb

## 2022-03-17 DIAGNOSIS — M25511 Pain in right shoulder: Secondary | ICD-10-CM

## 2022-03-17 NOTE — Progress Notes (Signed)
    SUBJECTIVE:   CHIEF COMPLAINT / HPI:   Patient is a 51 year old man who presents today for right shoulder pain He reports he has had pain on the right shoulder for years.   However on Monday had worsening pain, unable to lift his right arm above his head. He said this is different from his usual right shoulder pain. 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  Has had x-ray of the shoulder but no MRI or ultrasound in the past. Symptoms started years ago while building a deck where he overdid it with post hole digger since then his shoulder has not been the same. Has since improved since Monday night and yesterday.  Able to lift his overhead with some shoulder adjustments. At the gym he is unable to do a plank or overhead press due to pain in the shoulder. He is right handed  PERTINENT  PMH / PSH: Reviewed  OBJECTIVE:   BP 126/70   Ht 6\' 2"  (1.88 m)   Wt 170 lb (77.1 kg)   BMI 21.83 kg/m    Physical Exam General: Alert, well appearing, NAD  Right Shoulder No notable deformity or swelling No tenderness on exam. Full range of motion but with shoulder adjustment and strength 4/5 with empty can. Negative Neer and Hawkins signs.  Normal internal and external rotation.  Limited MSK u/s right shoulder:  Biceps tendon intact Subscapularis and infraspinatus appear normal. Supraspinatus - small partial articular sided tear comprising only ~10% of supraspinatus width.  ASSESSMENT/PLAN:  Right shoulder pain - consistent with rotator cuff impingement and small partial thickness tear of supraspinatus.  Start home exercise program.  Aleve or ibuprofen if needed, icing.  Consider subacromial injection, physical therapy, nitro patches if not improving.  F/u in 6 weeks or prn if doing well.  Alen Bleacher, MD [place]

## 2022-03-17 NOTE — Patient Instructions (Signed)
You have rotator cuff impingement and a small partial tear of your supraspinatus. These should improve with conservative treatment. Try to avoid painful activities (overhead activities, lifting with extended arm) as much as possible. Aleve 2 tabs twice a day with food OR ibuprofen 3 tabs three times a day with food for pain and inflammation only as needed. Can take tylenol in addition to this. Consider physical therapy with transition to home exercise program. Do home exercise program with theraband and scapular stabilization exercises daily 3 sets of 10 once a day. If not improving at follow-up we will consider further imaging, injection, physical therapy, and/or nitro patches. Follow up with me in 6 weeks or as needed if you're doing well.

## 2022-04-28 ENCOUNTER — Ambulatory Visit: Payer: BC Managed Care – PPO | Admitting: Family Medicine

## 2022-05-04 ENCOUNTER — Encounter: Payer: BC Managed Care – PPO | Admitting: Family Medicine

## 2022-05-10 ENCOUNTER — Encounter: Payer: Self-pay | Admitting: Family Medicine

## 2022-05-10 ENCOUNTER — Ambulatory Visit: Payer: BC Managed Care – PPO | Admitting: Family Medicine

## 2022-05-10 VITALS — BP 134/81 | HR 66 | Temp 98.0°F | Ht 74.0 in | Wt 171.6 lb

## 2022-05-10 DIAGNOSIS — I10 Essential (primary) hypertension: Secondary | ICD-10-CM

## 2022-05-10 DIAGNOSIS — I480 Paroxysmal atrial fibrillation: Secondary | ICD-10-CM | POA: Diagnosis not present

## 2022-05-10 DIAGNOSIS — J029 Acute pharyngitis, unspecified: Secondary | ICD-10-CM | POA: Diagnosis not present

## 2022-05-10 DIAGNOSIS — J020 Streptococcal pharyngitis: Secondary | ICD-10-CM | POA: Diagnosis not present

## 2022-05-10 DIAGNOSIS — U071 COVID-19: Secondary | ICD-10-CM

## 2022-05-10 LAB — POC COVID19 BINAXNOW: SARS Coronavirus 2 Ag: POSITIVE — AB

## 2022-05-10 LAB — POCT RAPID STREP A (OFFICE): Rapid Strep A Screen: POSITIVE — AB

## 2022-05-10 MED ORDER — AMOXICILLIN 875 MG PO TABS: 875.0000 mg | ORAL_TABLET | Freq: Two times a day (BID) | ORAL | 0 refills | Status: DC

## 2022-05-10 MED ORDER — NIRMATRELVIR/RITONAVIR (PAXLOVID)TABLET: 3.0000 | ORAL_TABLET | Freq: Two times a day (BID) | ORAL | 0 refills | Status: AC

## 2022-05-10 NOTE — Assessment & Plan Note (Signed)
Follows with cardiology.  Regular rate and rhythm today. 

## 2022-05-10 NOTE — Patient Instructions (Signed)
It was very nice to see you today!  Your COVID and strep test are both positive.  Placed on amoxicillin for strep throat.  I will send in prescription for Paxlovid.  You can start this now or we can wait a couple of days before starting if you wish.  Please make sure that you are getting plenty of fluids.  Let us know if your symptoms or not proving over the next several days.  Take care, Dr Jimmey Ralph  PLEASE NOTE:  If you had any lab tests please let us know if you have not heard back within a few days. You may see your results on mychart before we have a chance to review them but we will give you a call once they are reviewed by [place]. If we ordered any referrals today, please let us know if you have not heard from their office within the next week.   Please try these tips to maintain a healthy lifestyle:  Eat at least 3 REAL meals and 1-2 snacks per day.  Aim for no more than 5 hours between eating.  If you eat breakfast, please do so within one hour of getting up.   Each meal should contain half fruits/vegetables, one quarter protein, and one quarter carbs (no bigger than a computer mouse)  Cut down on sweet beverages. This includes juice, soda, and sweet tea.   Drink at least 1 glass of water with each meal and aim for at least 8 glasses per day  Exercise at least 150 minutes every week.

## 2022-05-10 NOTE — Assessment & Plan Note (Signed)
At goal today on losartan 50 mg daily.  Increases risk for complication from covid.

## 2022-05-10 NOTE — Progress Notes (Signed)
   Johnathan Walker is a 51 y.o. male who presents today for an office visit.  Assessment/Plan:  New/Acute Problems: Strep Throat Patient's rapid strep positive.  We will start amoxicillin.  Encouraged hydration.  He can use over-the-counter meds as needed.  Follow-up as needed.  COVID Patient's rapid COVID positive as well.  He does have a few risk factors that increases risk for complication from COVID including essential hypertension and atrial fibrillation.  We will send in prescription for paxlovid.  He can also use over-the-counter meds as needed for this.  Encouraged hydration.  We discussed reasons to return to care.  Follow-up as needed.  Chronic Problems Addressed Today: Essential hypertension At goal today on losartan 50 mg daily.  Increases risk for complication from covid.   AF (atrial fibrillation) [facility]) s/p ablation Follows with cardiology.  Regular rate and rhythm today.     Subjective:  HPI:  See A/P for status of chronic conditions.  He is here today with sore throat, cough, and chills.  Symptoms started a few days ago.  He has had sick contacts with COVID.  Main concern today is sore throat.  Other symptoms seem to be improving.  His current symptoms feel like prior episodes of strep throat.  No shortness of breath.  No chest pain.  No fevers or chills.       Objective:  Physical Exam: BP 134/81   Pulse 66   Temp 98 F (36.7 C) (Temporal)   Ht 6\' 2"  (1.88 m)   Wt 171 lb 9.6 oz (77.8 kg)   SpO2 98%   BMI 22.03 kg/m   Gen: No acute distress, resting comfortably HEENT: OP erythematous with pustular lesions noted CV: Regular rate and rhythm with no murmurs appreciated Pulm: Normal work of breathing, clear to auscultation bilaterally with no crackles, wheezes, or rhonchi Neuro: Grossly normal, moves all extremities Psych: Normal affect and thought content      Brithany Whitworth M. , MD 05/10/2022 12:04 PM

## 2022-05-11 ENCOUNTER — Encounter: Payer: BC Managed Care – PPO | Admitting: Family Medicine

## 2022-05-13 ENCOUNTER — Telehealth: Payer: Self-pay | Admitting: Family Medicine

## 2022-05-13 NOTE — Telephone Encounter (Signed)
Pt States: -Recently prescribed Amoxicillin, after 05/10/22 appointment. -Side effect of diarrhea has begun.  -No other complaints/symptoms occurring.   Pt asks: -Could switching to penicillin be better for side effect, and not interfere with current antibiotic progress?   Pt Requests: -return phone call.   Pharmacy: [facility] [address], [place] - [address], [place] [address] Phone: [phone]  Fax: [phone]

## 2022-05-14 NOTE — Telephone Encounter (Signed)
Please advise 

## 2022-05-17 NOTE — Telephone Encounter (Signed)
Please check in with patient. We can send in a zpack if he is still having diarrhea or if his strep throat has not improved.  Katina Degree. Jimmey Ralph, MD 05/17/2022 12:43 PM

## 2022-05-17 NOTE — Telephone Encounter (Signed)
Unable to contact patient, voice mail is full  

## 2022-05-18 ENCOUNTER — Telehealth: Payer: Self-pay | Admitting: Family Medicine

## 2022-05-18 NOTE — Telephone Encounter (Signed)
Pt states: -Prescribed paxlovid, did not take it because he was feeling better.  -Found information: only work if taken within the first 5 days of positive/symptoms starting. -He is past day 5 -Feeling rough and improvement has plateaued. -Still has Paxlovid in possession.   Pt requests: -Call back with instructions.

## 2022-05-18 NOTE — Telephone Encounter (Signed)
Please advise 

## 2022-05-18 NOTE — Telephone Encounter (Signed)
No benefit to starting paxlovid at this point. Recommend he be seen if symptoms are not improving.  Katina Degree. Jimmey Ralph, MD 05/18/2022 3:38 PM

## 2022-05-20 ENCOUNTER — Ambulatory Visit: Payer: BC Managed Care – PPO | Admitting: Family Medicine

## 2022-05-20 ENCOUNTER — Encounter: Payer: Self-pay | Admitting: Family Medicine

## 2022-05-20 VITALS — BP 118/69 | HR 86 | Temp 97.8°F | Ht 74.0 in | Wt 177.6 lb

## 2022-05-20 DIAGNOSIS — I10 Essential (primary) hypertension: Secondary | ICD-10-CM

## 2022-05-20 DIAGNOSIS — M129 Arthropathy, unspecified: Secondary | ICD-10-CM | POA: Diagnosis not present

## 2022-05-20 DIAGNOSIS — U071 COVID-19: Secondary | ICD-10-CM | POA: Diagnosis not present

## 2022-05-20 DIAGNOSIS — L409 Psoriasis, unspecified: Secondary | ICD-10-CM | POA: Diagnosis not present

## 2022-05-20 LAB — POC COVID19 BINAXNOW: SARS Coronavirus 2 Ag: POSITIVE — AB

## 2022-05-20 MED ORDER — AZITHROMYCIN 250 MG PO TABS: ORAL_TABLET | ORAL | 0 refills | Status: DC

## 2022-05-20 NOTE — Telephone Encounter (Signed)
See previews phone message

## 2022-05-20 NOTE — Assessment & Plan Note (Signed)
Overall his psoriasis symptoms seem to be stable though he is concerned about possible consequences and other complications from psoriasis.  He is already having quite a bit of joint pain especially in his hands and shoulders.  Will place referral to rheumatology for further management and evaluation for psoriatic arthritis.

## 2022-05-20 NOTE — Progress Notes (Signed)
   Johnathan Walker is a 51 y.o. male who presents today for an office visit.  Assessment/Plan:  New/Acute Problems: Cough / Sinusitis Patient's rapid COVID was positive again today.  Discussed with patient that this does not necessarily indicate an active infection and then it may be picking up pieces of virus that his body has already cleared.  He has no signs of bronchitis or pneumonia today though does have quite a bit of sinusitis and upper airway congestion.  It is possible he may have developed sinusitis.  Given length of symptoms will start azithromycin.  He will let me know if his symptoms or not improving by next week.  If still has issues with lethargy and cough will need referral to pulmonology.  We discussed reasons return to care.  Follow-up as needed.  Chronic Problems Addressed Today: Psoriasis Overall his psoriasis symptoms seem to be stable though he is concerned about possible consequences and other complications from psoriasis.  He is already having quite a bit of joint pain especially in his hands and shoulders.  Will place referral to rheumatology for further management and evaluation for psoriatic arthritis.  Essential hypertension At goal on losartan 50 mg daily.  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  Will place referral to rheumatology for further evaluation and management.     Subjective:  HPI:  Patient here for follow up. He was here 10 days ago for sore throat.  Rapid strep and COVID both positive at that time.  We treated him with a course of amoxicillin.  Unfortunately this caused significant amount of diarrhea and he had to stop taking this currently.  He completed all the 2 doses of the antibiotic.  Throat but has improved significantly since her last visit.  We also sent a prescription for paxlovid.  Patient has not yet started this.  He  symptoms improved however still has quite a bit of cough and lethargy.  Some chills.  No fevers.  He has tried taking over-the-counter meds with no improvement.  No chest pain or shortness of breath.  He has tried using Nettie pot with some improvement.  See A/P for status of chronic conditions.       Objective:  Physical Exam: BP 118/69 (BP Location: Left Arm, Patient Position: Sitting)   Pulse 86   Temp 97.8 F (36.6 C) (Temporal)   Ht 6\' 2"  (1.88 m)   Wt 177 lb 9.6 oz (80.6 kg)   SpO2 97%   BMI 22.80 kg/m   Gen: No acute distress, resting comfortably HEENT: Bilateral TMs with clear effusion.  OP erythematous.  Nasal mucosa erythematous and boggy. CV: Regular rate and rhythm with no murmurs appreciated Pulm: Normal work of breathing, clear to auscultation bilaterally with no crackles, wheezes, or rhonchi Skin: Longitudinal ridges and nail of first digit bilaterally Neuro: Grossly normal, moves all extremities Psych: Normal affect and thought content      Kehinde Bowdish M. , MD 05/20/2022 12:16 PM

## 2022-05-20 NOTE — Assessment & Plan Note (Signed)
Likely does have some underlying degenerative changes though concern for possible psoriatic arthritis involvement as well especially in his hands.  Will place referral to rheumatology for further evaluation and management.

## 2022-05-20 NOTE — Assessment & Plan Note (Signed)
At goal on losartan 50 mg daily. 

## 2022-05-20 NOTE — Telephone Encounter (Signed)
Patient seen today

## 2022-05-20 NOTE — Patient Instructions (Signed)
It was very nice to see you today!  Please start the azithromycin.  I will refer you to see rheumatologist.  I will see you back soon for your annual physical.  Please come back to see [place] sooner if needed.  Take care, Dr Jimmey Ralph  PLEASE NOTE:  If you had any lab tests please let us know if you have not heard back within a few days. You may see your results on mychart before we have a chance to review them but we will give you a call once they are reviewed by [place]. If we ordered any referrals today, please let us know if you have not heard from their office within the next week.   Please try these tips to maintain a healthy lifestyle:  Eat at least 3 REAL meals and 1-2 snacks per day.  Aim for no more than 5 hours between eating.  If you eat breakfast, please do so within one hour of getting up.   Each meal should contain half fruits/vegetables, one quarter protein, and one quarter carbs (no bigger than a computer mouse)  Cut down on sweet beverages. This includes juice, soda, and sweet tea.   Drink at least 1 glass of water with each meal and aim for at least 8 glasses per day  Exercise at least 150 minutes every week.

## 2022-05-26 ENCOUNTER — Ambulatory Visit: Payer: BC Managed Care – PPO | Admitting: Family Medicine

## 2022-05-26 ENCOUNTER — Encounter: Payer: Self-pay | Admitting: Family Medicine

## 2022-05-26 VITALS — BP 121/72 | HR 77 | Temp 98.0°F | Ht 74.0 in | Wt 177.0 lb

## 2022-05-26 DIAGNOSIS — I10 Essential (primary) hypertension: Secondary | ICD-10-CM | POA: Diagnosis not present

## 2022-05-26 DIAGNOSIS — M129 Arthropathy, unspecified: Secondary | ICD-10-CM

## 2022-05-26 MED ORDER — PREDNISONE 50 MG PO TABS: ORAL_TABLET | ORAL | 0 refills | Status: DC

## 2022-05-26 NOTE — Assessment & Plan Note (Signed)
Has referral to dermatology pending to look for any potential rheumatologic causes.  Symptoms are overall stable.  Likely have some improvement with the prednisone for his sinusitis.

## 2022-05-26 NOTE — Patient Instructions (Signed)
It was very nice to see you today!  You have inflammation in your sinuses.  I am hopeful that we will continue to improve.  Please start the prednisone.  Let us know if not improving.  Take care, Dr Jimmey Ralph  PLEASE NOTE:  If you had any lab tests, please let us know if you have not heard back within a few days. You may see your results on mychart before we have a chance to review them but we will give you a call once they are reviewed by [place].   If we ordered any referrals today, please let us know if you have not heard from their office within the next week.   If you had any urgent prescriptions sent in today, please check with the pharmacy within an hour of our visit to make sure the prescription was transmitted appropriately.   Please try these tips to maintain a healthy lifestyle:  Eat at least 3 REAL meals and 1-2 snacks per day.  Aim for no more than 5 hours between eating.  If you eat breakfast, please do so within one hour of getting up.   Each meal should contain half fruits/vegetables, one quarter protein, and one quarter carbs (no bigger than a computer mouse)  Cut down on sweet beverages. This includes juice, soda, and sweet tea.   Drink at least 1 glass of water with each meal and aim for at least 8 glasses per day  Exercise at least 150 minutes every week.

## 2022-05-26 NOTE — Assessment & Plan Note (Signed)
At goal on losartan 50 mg daily. 

## 2022-05-26 NOTE — Progress Notes (Signed)
   Johnathan Walker is a 51 y.o. male who presents today for an office visit.  Assessment/Plan:  New/Acute Problems: Sinusitis  It is reassuring that symptoms have started to improve the last couple of days though still has a lot of congestion and nasal passageways and some signs of a middle ear effusion.  He has had 2 rounds of antibiotics with amoxicillin and azithromycin.  Do not think we need to do any further antibiotics at this point.  Will start prednisone burst.  He has done well with this in the past.  We did discuss Astelin nasal spray however he declined.  Has been using a Nettie pot.  He will continue using over-the-counter medications.  He will let me know if not proving in the next several days and we can consider referral to ENT at that point.  Chronic Problems Addressed Today: Essential hypertension At goal on losartan 50 mg daily.  Arthropathy Has referral to dermatology pending to look for any potential rheumatologic causes.  Symptoms are overall stable.  Likely have some improvement with the prednisone for his sinusitis.      Subjective:  HPI:  Patient here for follow up.  Seen here 2 weeks ago for sore throat.  Positive strep and rapid COVID.  He was started on amoxicillin and paxlovid.  Symptoms not improved he was seen here 6 days ago.  He was still having a lot of sinus congestion and cough at that time.  We started him on azithromycin.  Symptoms are improving. He does feel better than he did at his last visit. His has some improvement especially over the last day or so. He finished his course of azithromycin.  He thinks that it helped.  He has been using a Nettie pot with improvement.  Wife was sick with similar symptoms initially however she seems to have recovered.       Objective:  Physical Exam: BP 121/72   Pulse 77   Temp 98 F (36.7 C) (Temporal)   Ht 6\' 2"  (1.88 m)   Wt 177 lb (80.3 kg)   SpO2 99%   BMI 22.73 kg/m   Gen: No acute distress, resting  comfortably HEENT: TMs with clear effusion.  OP clear.  Nasal mucosa erythematous and boggy bilaterally CV: Regular rate and rhythm with no murmurs appreciated Pulm: Normal work of breathing, clear to auscultation bilaterally with no crackles, wheezes, or rhonchi Neuro: Grossly normal, moves all extremities Psych: Normal affect and thought content      Nikita Humble M. , MD 05/26/2022 10:21 AM

## 2022-06-08 ENCOUNTER — Encounter: Payer: BC Managed Care – PPO | Admitting: Family Medicine

## 2022-06-15 ENCOUNTER — Encounter: Payer: Self-pay | Admitting: Family Medicine

## 2022-06-15 ENCOUNTER — Ambulatory Visit (INDEPENDENT_AMBULATORY_CARE_PROVIDER_SITE_OTHER): Payer: BC Managed Care – PPO | Admitting: Family Medicine

## 2022-06-15 VITALS — BP 120/70 | HR 76 | Temp 97.7°F | Ht 74.0 in | Wt 172.0 lb

## 2022-06-15 DIAGNOSIS — Z0001 Encounter for general adult medical examination with abnormal findings: Secondary | ICD-10-CM | POA: Diagnosis not present

## 2022-06-15 DIAGNOSIS — Z1159 Encounter for screening for other viral diseases: Secondary | ICD-10-CM

## 2022-06-15 DIAGNOSIS — R739 Hyperglycemia, unspecified: Secondary | ICD-10-CM | POA: Diagnosis not present

## 2022-06-15 DIAGNOSIS — I10 Essential (primary) hypertension: Secondary | ICD-10-CM | POA: Diagnosis not present

## 2022-06-15 DIAGNOSIS — I480 Paroxysmal atrial fibrillation: Secondary | ICD-10-CM | POA: Diagnosis not present

## 2022-06-15 DIAGNOSIS — Z13220 Encounter for screening for lipoid disorders: Secondary | ICD-10-CM

## 2022-06-15 LAB — CBC
HCT: 41.6 % (ref 39.0–52.0)
Hemoglobin: 14.4 g/dL (ref 13.0–17.0)
MCHC: 34.5 g/dL (ref 30.0–36.0)
MCV: 94.6 fl (ref 78.0–100.0)
Platelets: 198 10*3/uL (ref 150.0–400.0)
RBC: 4.4 Mil/uL (ref 4.22–5.81)
RDW: 12.7 % (ref 11.5–15.5)
WBC: 3.7 10*3/uL — ABNORMAL LOW (ref 4.0–10.5)

## 2022-06-15 LAB — COMPREHENSIVE METABOLIC PANEL
ALT: 23 U/L (ref 0–53)
AST: 21 U/L (ref 0–37)
Albumin: 4.4 g/dL (ref 3.5–5.2)
Alkaline Phosphatase: 60 U/L (ref 39–117)
BUN: 18 mg/dL (ref 6–23)
CO2: 28 mEq/L (ref 19–32)
Calcium: 9.4 mg/dL (ref 8.4–10.5)
Chloride: 104 mEq/L (ref 96–112)
Creatinine, Ser: 0.87 mg/dL (ref 0.40–1.50)
GFR: 100.35 mL/min (ref >60.00)
Glucose, Bld: 97 mg/dL (ref 70–99)
Potassium: 4.5 mEq/L (ref 3.5–5.1)
Sodium: 141 mEq/L (ref 135–145)
Total Bilirubin: 0.9 mg/dL (ref 0.2–1.2)
Total Protein: 6.7 g/dL (ref 6.0–8.3)

## 2022-06-15 LAB — HEMOGLOBIN A1C: Hgb A1c MFr Bld: 5.5 % (ref 4.6–6.5)

## 2022-06-15 LAB — LIPID PANEL
Cholesterol: 160 mg/dL (ref 0–200)
HDL: 69.1 mg/dL (ref >39.00)
LDL Cholesterol: 84 mg/dL (ref 0–99)
NonHDL: 91.19
Total CHOL/HDL Ratio: 2
Triglycerides: 38 mg/dL (ref 0.0–149.0)
VLDL: 7.6 mg/dL (ref 0.0–40.0)

## 2022-06-15 LAB — TSH: TSH: 1.13 u[IU]/mL (ref 0.35–5.50)

## 2022-06-15 LAB — SEDIMENTATION RATE: Sed Rate: 5 mm/hr (ref 0–20)

## 2022-06-15 LAB — TESTOSTERONE: Testosterone: 294.52 ng/dL — ABNORMAL LOW (ref 300.00–890.00)

## 2022-06-15 NOTE — Assessment & Plan Note (Signed)
Follows with cardiology.  Regular rate and rhythm today. 

## 2022-06-15 NOTE — Assessment & Plan Note (Signed)
Blood pressure at goal on losartan 25 mg daily.  Check labs.

## 2022-06-15 NOTE — Patient Instructions (Signed)
It was very nice to see you today!  We will check blood work today.  Your numbness in your hand is likely due to your sleep position.  Please let me know if this worsens.  Please keep up the great work with your diet and exercise.  Will see you back in a year for your next physical. Please come back sooner if needed.   Take care, Dr Jerline Pain  PLEASE NOTE:  If you had any lab tests, please let us know if you have not heard back within a few days. You may see your results on mychart before we have a chance to review them but we will give you a call once they are reviewed by [place].   If we ordered any referrals today, please let us know if you have not heard from their office within the next week.   If you had any urgent prescriptions sent in today, please check with the pharmacy within an hour of our visit to make sure the prescription was transmitted appropriately.   Please try these tips to maintain a healthy lifestyle:  Eat at least 3 REAL meals and 1-2 snacks per day.  Aim for no more than 5 hours between eating.  If you eat breakfast, please do so within one hour of getting up.   Each meal should contain half fruits/vegetables, one quarter protein, and one quarter carbs (no bigger than a computer mouse)  Cut down on sweet beverages. This includes juice, soda, and sweet tea.   Drink at least 1 glass of water with each meal and aim for at least 8 glasses per day  Exercise at least 150 minutes every week.    Preventive Care 61-12 Years Old, Male Preventive care refers to lifestyle choices and visits with your health care provider that can promote health and wellness. Preventive care visits are also called wellness exams. What can I expect for my preventive care visit? Counseling During your preventive care visit, your health care provider may ask about your: Medical history, including: Past medical problems. Family medical history. Current health, including: Emotional  well-being. Home life and relationship well-being. Sexual activity. Lifestyle, including: Alcohol, nicotine or tobacco, and drug use. Access to firearms. Diet, exercise, and sleep habits. Safety issues such as seatbelt and bike helmet use. Sunscreen use. Work and work Statistician. Physical exam Your health care provider will check your: Height and weight. These may be used to calculate your BMI (body mass index). BMI is a measurement that tells if you are at a healthy weight. Waist circumference. This measures the distance around your waistline. This measurement also tells if you are at a healthy weight and may help predict your risk of certain diseases, such as type 2 diabetes and high blood pressure. Heart rate and blood pressure. Body temperature. Skin for abnormal spots. What immunizations do I need?  Vaccines are usually given at various ages, according to a schedule. Your health care provider will recommend vaccines for you based on your age, medical history, and lifestyle or other factors, such as travel or where you work. What tests do I need? Screening Your health care provider may recommend screening tests for certain conditions. This may include: Lipid and cholesterol levels. Diabetes screening. This is done by checking your blood sugar (glucose) after you have not eaten for a while (fasting). Hepatitis B test. Hepatitis C test. HIV (human immunodeficiency virus) test. STI (sexually transmitted infection) testing, if you are at risk. Lung cancer screening. Prostate cancer  screening. Colorectal cancer screening. Talk with your health care provider about your test results, treatment options, and if necessary, the need for more tests. Follow these instructions at home: Eating and drinking  Eat a diet that includes fresh fruits and vegetables, whole grains, lean protein, and low-fat dairy products. Take vitamin and mineral supplements as recommended by your health care  provider. Do not drink alcohol if your health care provider tells you not to drink. If you drink alcohol: Limit how much you have to 0-2 drinks a day. Know how much alcohol is in your drink. In the [place] one drink equals one 12 oz bottle of beer (355 mL), one 5 oz glass of wine (148 mL), or one 1 oz glass of hard liquor (44 mL). Lifestyle Brush your teeth every morning and night with fluoride toothpaste. Floss one time each day. Exercise for at least 30 minutes 5 or more days each week. Do not use any products that contain nicotine or tobacco. These products include cigarettes, chewing tobacco, and vaping devices, such as e-cigarettes. If you need help quitting, ask your health care provider. Do not use drugs. If you are sexually active, practice safe sex. Use a condom or other form of protection to prevent STIs. Take aspirin only as told by your health care provider. Make sure that you understand how much to take and what form to take. Work with your health care provider to find out whether it is safe and beneficial for you to take aspirin daily. Find healthy ways to manage stress, such as: Meditation, yoga, or listening to music. Journaling. Talking to a trusted person. Spending time with friends and family. Minimize exposure to UV radiation to reduce your risk of skin cancer. Safety Always wear your seat belt while driving or riding in a vehicle. Do not drive: If you have been drinking alcohol. Do not ride with someone who has been drinking. When you are tired or distracted. While texting. If you have been using any mind-altering substances or drugs. Wear a helmet and other protective equipment during sports activities. If you have firearms in your house, make sure you follow all gun safety procedures. What's next? Go to your health care provider once a year for an annual wellness visit. Ask your health care provider how often you should have your eyes and teeth checked. Stay up to  date on all vaccines. This information is not intended to replace advice given to you by your health care provider. Make sure you discuss any questions you have with your health care provider. Document Revised: 11/19/2020 Document Reviewed: 11/19/2020 Elsevier Patient Education  [place].

## 2022-06-15 NOTE — Progress Notes (Signed)
Chief Complaint:  Johnathan Walker is a 52 y.o. male who presents today for his annual comprehensive physical exam.    Assessment/Plan:  New/Acute Problems: COVID Symptoms have mostly resolved.  Still does have some decreased exercise capacity and has a higher basal heart rate than what he is accustomed to.  Discussed with patient it may take several more weeks before he is back to his baseline.  He will let me know if symptoms do not continue to improve and we can consider referral to pulmonology.  Paresthesias No red flags.  History consistent with transient nerve impingement related to sleep position.  He had a reassuring exam today.  We did discuss referral to neurology or potentially obtaining a nerve conduction study however he deferred for now.  We discussed reasons to return to care.  Continue with watchful waiting.  Chronic Problems Addressed Today: Essential hypertension Blood pressure at goal on losartan 25 mg daily.  Check labs.  AF (atrial fibrillation) [facility]) s/p ablation Follows with cardiology.  Regular rate and rhythm today.  Preventative Healthcare: Declined vaccines.  Check labs today.  Patient Counseling(The following topics were reviewed and/or handout was given):  -Nutrition: Stressed importance of moderation in sodium/caffeine intake, saturated fat and cholesterol, caloric balance, sufficient intake of fresh fruits, vegetables, and fiber.  -Stressed the importance of regular exercise.   -Substance Abuse: Discussed cessation/primary prevention of tobacco, alcohol, or other drug use; driving or other dangerous activities under the influence; availability of treatment for abuse.   -Injury prevention: Discussed safety belts, safety helmets, smoke detector, smoking near bedding or upholstery.   -Sexuality: Discussed sexually transmitted diseases, partner selection, use of condoms, avoidance of unintended pregnancy and contraceptive alternatives.   -Dental health:  Discussed importance of regular tooth brushing, flossing, and dental visits.  -Health maintenance and immunizations reviewed. Please refer to Health maintenance section.  Return to care in 1 year for next preventative visit.     Subjective:  HPI:  He has no acute complaints today.   He is getting numbness in his arm when sleeping. This happens most nights. Resolves after waking up and changing position.  Occasionally gets similar symptoms in the middle the day when using the computer.  Has numbness and tingling in fingers in his right hand.  Resolves when he changes his activity.  No weakness.  No injuries or other obvious precipitating events.  Lifestyle Diet: Balanced. Plenty of fruits and vegetables.  Exercise: Runs 26 miles per week. Goes to gym 4 times per week.      06/15/2022    8:32 AM  Depression screen PHQ 2/9  Decreased Interest 0  Down, Depressed, Hopeless 0  PHQ - 2 Score 0   ROS: Per HPI, otherwise a complete review of systems was negative.   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/20/2022   Chronic low back pain 11/18/2020   Psoriasis 11/18/2020   AF (atrial fibrillation) (HCC) s/p ablation 03/17/2017   Essential hypertension 11/16/2016   Past Surgical History:  Procedure Laterality Date   ATRIAL FIBRILLATION ABLATION  03/17/2017   ATRIAL FIBRILLATION ABLATION N/A 03/17/2017   Procedure: Atrial Fibrillation Ablation;  Surgeon: Regan Lemming, MD;  Location: [facility];  Service: Cardiovascular;  Laterality: N/A;    Family History  Problem Relation Age of Onset   Atrial fibrillation Mother    Diabetes Father     Hypertension Father    Lung cancer Father     Medications- reviewed and updated Current Outpatient Medications  Medication Sig Dispense Refill   Glucosamine 500 MG CAPS Take by mouth.     losartan (COZAAR) 50 MG tablet Take 25 mg by mouth daily.     niacin (SLO-NIACIN) 500 MG tablet Take 1,500 mg by mouth at bedtime.     Omega-3 Fatty Acids (FISH OIL OMEGA-3 PO) Take 2 capsules by mouth.     No current facility-administered medications for this visit.    Allergies-reviewed and updated Allergies  Allergen Reactions   Amoxicillin Diarrhea    Social History   Socioeconomic History   Marital status: Married    Spouse name: Not on file   Number of children: Not on file   Years of education: Not on file   Highest education level: Not on file  Occupational History   Not on file  Tobacco Use   Smoking status: Never   Smokeless tobacco: Never  Vaping Use   Vaping Use: Never used  Substance and Sexual Activity   Alcohol use: No   Drug use: No   Sexual activity: Not on file  Other Topics Concern   Not on file  Social History Narrative   Not on file   Social Determinants of Health   Financial Resource Strain: Not on file  Food Insecurity: Not on file  Transportation Needs: Not on file  Physical Activity: Not on file  Stress: Not on file  Social Connections: Not on file        Objective:  Physical Exam: BP 120/70   Pulse 76   Temp 97.7 F (36.5 C) (Temporal)   Ht 6\' 2"  (1.88 m)   Wt 172 lb (78 kg)   SpO2 98%   BMI 22.08 kg/m   Body mass index is 22.08 kg/m. Wt Readings from Last 3 Encounters:  06/15/22 172 lb (78 kg)  05/26/22 177 lb (80.3 kg)  05/20/22 177 lb 9.6 oz (80.6 kg)   Gen: NAD, resting comfortably HEENT: TMs normal bilaterally. OP clear. No thyromegaly noted.  CV: RRR with no murmurs appreciated Pulm: NWOB, CTAB with no crackles, wheezes, or rhonchi GI: Normal bowel sounds present. Soft, Nontender, Nondistended. MSK: no edema, cyanosis, or  clubbing noted - Right Arm: No deformities.  Strength 5 out of 5 throughout.  Sensation light touch intact throughout.  Tinel's sign negative at wrist and medial epicondyle.  Spurling negative on the right. Skin: warm, dry Neuro: CN2-12 grossly intact. Strength 5/5 in upper and lower extremities. Reflexes symmetric and intact bilaterally.  Psych: Normal affect and thought content     Aryonna Gunnerson M. Jerline Pain, MD 06/15/2022 9:02 AM

## 2022-06-16 LAB — HEPATITIS C ANTIBODY: Hepatitis C Ab: NONREACTIVE

## 2022-06-17 NOTE — Progress Notes (Signed)
Please inform patient of the following:  His testosterone level is low.  Recommend he come back for repeat testing if he is interested in replacement.  Please place future order for testosterone.  The rest of his labs are all stable.  His blood sugar is within normal ranges and his cholesterol levels are at goal.  Would like for him to keep up the good work and we can recheck all of this in a year.

## 2022-06-23 ENCOUNTER — Other Ambulatory Visit: Payer: Self-pay | Admitting: *Deleted

## 2022-06-23 DIAGNOSIS — R7989 Other specified abnormal findings of blood chemistry: Secondary | ICD-10-CM

## 2022-06-28 ENCOUNTER — Other Ambulatory Visit (INDEPENDENT_AMBULATORY_CARE_PROVIDER_SITE_OTHER): Payer: BC Managed Care – PPO

## 2022-06-28 DIAGNOSIS — R7989 Other specified abnormal findings of blood chemistry: Secondary | ICD-10-CM

## 2022-06-28 LAB — TESTOSTERONE: Testosterone: 275.29 ng/dL — ABNORMAL LOW (ref 300.00–890.00)

## 2022-07-02 NOTE — Progress Notes (Signed)
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  Recommend referral to urology to discuss replacement if he is interested.

## 2022-07-02 NOTE — Progress Notes (Signed)
Office Visit Note  Patient: Johnathan Walker             Date of Birth: 04/05/1971           MRN: [id number]             PCP: Ardith Dark, MD Referring: Ardith Dark, MD Visit Date: 07/14/2022 Occupation: @GUAROCC @  Subjective:  Psoriasis   History of Present Illness: Johnathan Walker is a 52 y.o. male who presents today for a new patient consultation as requested by his PCP, Dr. 44.  Patient reports that he was diagnosed with psoriasis on his scalp in 2020 by his dermatologist Dr. 2021 who has since retired.  He has not currently followed by dermatology.  Patient reports that he was given a prescription scalp solution which was helpful but caused thinning of the skin.  He states he has been using over-the-counter Neutrogena T-Gel which has been keeping his symptoms at bay.  He states he has 1 small patch of psoriasis on his right forearm.  Patient reports that he thinks his father has undiagnosed psoriasis otherwise has no other family history of psoriasis that he is aware of.  He states that his mother has a history of psoriasis and was followed by a rheumatologist but he is unsure what type of arthritis she was previously diagnosed with.  Patient reports that Dr. Arlyce Dice had noticed ridging in his fingernails which prompted referral to rheumatology for further evaluation given his history of psoriasis.   Patient reports that he has had occasional arthralgias in the past.  He states he was diagnosed with Achilles tendinitis 1 year ago which has resolved since performing extended heel lifts at the gym.  He denies any history of plantar fasciitis or patella tendinitis.  He denies any SI joint pain.  In the past he has evaluated at the sports medicine clinic for pain in his right shoulder.  His right shoulder pain was exacerbated by digging holes for prolonged periods of time.  He had an ultrasound of the right shoulder which revealed a mild small tear of the supraspinatus tendon.  He had a  right shoulder joint cortisone injection in the past which brought a temporary relief but his symptoms recurred after cleaning 2 cars on the same day.  He has not wanted to proceed with a repeat cortisone injection since then.  He has been performing range of motion and strengthening exercises at the gym which have been keeping his right shoulder joint pain at bay.  He remains very active with his job as well as exercising on a regular basis.  He is currently training for a marathon which he plans on running on his [birth year] on 07/30/2021.  He uses weightlifting machines at the gym for strengthening as well.  Patient reports that he has been taking glucosamine for over a decade as well as taking fish oil.  He states that if he misses glucosamine he notices increased stiffness in both hands but denies any pain or swelling in his hands. He denies any signs or symptoms of uveitis in the past.    Activities of Daily Living:  Patient reports morning stiffness for 5-10 minutes.   Patient Denies nocturnal pain.  Difficulty dressing/grooming: Denies Difficulty climbing stairs: Denies Difficulty getting out of chair: Denies Difficulty using hands for taps, buttons, cutlery, and/or writing: Denies  Review of Systems  Constitutional:  Negative for fatigue.  HENT:  Negative for mouth sores and mouth dryness.   Eyes:  Negative for dryness.  Respiratory:  Negative for shortness of breath.   Cardiovascular:  Negative for chest pain and palpitations.  Gastrointestinal:  Negative for blood in stool, constipation and diarrhea.  Endocrine: Negative for increased urination.  Genitourinary:  Negative for involuntary urination.  Musculoskeletal:  Positive for morning stiffness. Negative for joint pain, gait problem, joint pain, joint swelling, myalgias, muscle weakness, muscle tenderness and myalgias.  Skin:  Negative for color change, rash, hair loss and sensitivity to sunlight.  Allergic/Immunologic: Negative for  susceptible to infections.  Neurological:  Negative for dizziness and headaches.  Hematological:  Negative for swollen glands.  Psychiatric/Behavioral:  Negative for depressed mood and sleep disturbance. The patient is not nervous/anxious.     PMFS History:  Patient Active Problem List   Diagnosis Date Noted   Arthropathy 05/20/2022   Chronic low back pain 11/18/2020   Psoriasis 11/18/2020   AF (atrial fibrillation) (HCC) s/p ablation 03/17/2017   Essential hypertension 11/16/2016    Past Medical History:  Diagnosis Date   Atrial fibrillation with rapid ventricular response (HCC) 11/16/2016   Essential hypertension 11/16/2016   Hypercholesteremia    Hypertension    New onset atrial fibrillation (HCC) 11/15/2016    Family History  Problem Relation Age of Onset   Atrial fibrillation Mother    Diabetes Father    Hypertension Father    Lung cancer Father    Past Surgical History:  Procedure Laterality Date   ATRIAL FIBRILLATION ABLATION  03/17/2017   ATRIAL FIBRILLATION ABLATION N/A 03/17/2017   Procedure: Atrial Fibrillation Ablation;  Surgeon: Regan Lemming, MD;  Location: [facility];  Service: Cardiovascular;  Laterality: N/A;   Social History   Social History Narrative   Not on file   Immunization History  Administered Date(s) Administered   Td 04/11/2003   Tdap 07/03/2015     Objective: Vital Signs: BP 129/68 (BP Location: Right Arm, Patient Position: Sitting, Cuff Size: Small)   Pulse 74   Resp 16   Ht 6\' 2"  (1.88 m)   Wt 174 lb (78.9 kg)   BMI 22.34 kg/m    Physical Exam Vitals and nursing note reviewed.  Constitutional:      Appearance: He is well-developed.  HENT:     Head: Normocephalic and atraumatic.  Eyes:     Conjunctiva/sclera: Conjunctivae normal.     Pupils: Pupils are equal, round, and reactive to light.  Cardiovascular:     Rate and Rhythm: Normal rate and regular rhythm.     Heart sounds: Normal heart sounds.  Pulmonary:      Effort: Pulmonary effort is normal.     Breath sounds: Normal breath sounds.  Abdominal:     General: Bowel sounds are normal.     Palpations: Abdomen is soft.  Musculoskeletal:     Cervical back: Normal range of motion and neck supple.  Skin:    General: Skin is warm and dry.     Capillary Refill: Capillary refill takes less than 2 seconds.     Comments: 1 small patch of psoriasis noted on the dorsal aspect of the right forearm.  Patches of psoriasis on the crown of the scalp.  Neurological:     Mental Status: He is alert and oriented to person, place, and time.  Psychiatric:        Behavior: Behavior normal.       Musculoskeletal Exam: C-spine, thoracic spine, and lumbar spine good ROM.  No midline spinal tenderness.  No SI  joint tenderness.  Shoulder joints, elbow joints, wrist joints, MCPs, PIPs, DIPs have good range of motion with no synovitis.  Complete fist formation bilaterally.  Hip joints have good range of motion with no groin pain.  No tenderness over the trochanteric bursa bilaterally.  Knee joints have good range of motion with no warmth or effusion.  Ankle joints have good range of motion with no tenderness or synovitis.  No evidence of active Achilles tendinitis or plantar fasciitis currently.  Small dorsal spurs noted bilaterally.  Some thickening of the right first MTP joint.  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  CDAI Exam: CDAI Score: -- Patient Global: --; Provider Global: -- Swollen: --; Tender: -- Joint Exam 07/14/2022   No joint exam has been documented for this visit   There is currently no information documented on the homunculus. Go to the Rheumatology activity and complete the homunculus joint exam.  Investigation: No additional findings.  Imaging: XR Foot 2 Views Right  Result Date: 07/14/2022 First MTP severe narrowing was noted.  PIP and DIP narrowing was noted.  No intertarsal, tibiotalar or subtalar joint  space narrowing was noted.  Calcaneal spur was noted.  No erosive changes were noted. Impression: These findings are consistent with osteoarthritis of the foot.  XR Foot 2 Views Left  Result Date: 07/14/2022 PIP and DIP narrowing was noted.  No MTP, intertarsal, tibiotalar or subtalar joint space narrowing was noted.  No erosive changes were noted. Impression: Unremarkable x-rays of the left foot.  XR Hand 2 View Left  Result Date: 07/14/2022 Mild CMC, first MCP, PIP and DIP narrowing was noted.  No other MCP, intercarpal or radiocarpal joint space narrowing was noted.  No erosive changes were noted. Impression: These findings are consistent with osteoarthritis of the hand.  XR Hand 2 View Right  Result Date: 07/14/2022 [facility], first MCP narrowing was noted.  Mild PIP and DIP narrowing was noted.  No MCP, intercarpal or radiocarpal joint space narrowing was noted.  No erosive changes were noted. Impression: These findings are consistent with osteoarthritis of the hand.   Recent Labs: Lab Results  Component Value Date   WBC 3.7 (L) 06/15/2022   HGB 14.4 06/15/2022   PLT 198.0 06/15/2022   NA 141 06/15/2022   K 4.5 06/15/2022   CL 104 06/15/2022   CO2 28 06/15/2022   GLUCOSE 97 06/15/2022   BUN 18 06/15/2022   CREATININE 0.87 06/15/2022   BILITOT 0.9 06/15/2022   ALKPHOS 60 06/15/2022   AST 21 06/15/2022   ALT 23 06/15/2022   PROT 6.7 06/15/2022   ALBUMIN 4.4 06/15/2022   CALCIUM 9.4 06/15/2022   GFRAA 121 03/07/2017    Speciality Comments: No specialty comments available.  Procedures:  No procedures performed Allergies: Amoxicillin  . Assessment / Plan:     Visit Diagnoses: Pain in both hands - 06/15/22: ESR 5.  History of psoriasis.  He experiences intermittent stiffness in both hands if he is not taking glucosamine consistently.  The patient has been taking glucosamine for the past 10 to 11 years but if he has a He notices increased stiffness but no swelling or tenderness in his  hands.  On examination he had no tenderness or synovitis today.  No signs of tenosynovitis noted.  He was able to make a complete fist bilaterally.  No signs of dactylitis were noted.   The patient has family history of arthritis -mother was followed by a rheumatologist.  He is unsure what type of arthritis she was previously diagnosed with but will try to find out a more detailed family history.   X-rays of both hands were obtained today.  The following lab work was also obtained for further evaluation.  Results will be discussed as new patient follow-up visit.  Plan: XR Hand 2 View Right, XR Hand 2 View Left, COMPLETE METABOLIC PANEL WITH GFR, CBC with Differential/Platelet, Sedimentation rate, Rheumatoid factor, Cyclic citrul peptide antibody, IgG, C-reactive protein, Glucose 6 phosphate dehydrogenase  Psoriasis: Diagnosed in 2020 by Dr. Rush Farmer dermatologist.  Not currently followed by a dermatologist. No known history of uveitis, Crohn's disease, ulcerative colitis, or inflammatory back pain.  He has been experiencing intermittent arthralgias over the years prompting the referral to [place] for further evaluation to rule out psoriatic arthritis. For management of psoriasis he previously tried a prescription strength external scalp solution which was helpful but caused thinning of the skin (clobetasol sounded familiar to the patient-no derm records to review).  He has been using Neutrogena T-Gel solution as needed.  He has small scattered patches of psoriasis on the crown of his scalp currently and 1 small patch of psoriasis on the dorsal aspect of his right forearm currently.  Pictures were uploaded above.   Briefly discussed systemic treatment options for psoriasis including the possible use of Otezla versus [employer].  He is apprehensive to take any immunosuppressive agents.  Different treatment options will be discussed at his new patient follow-up visit in detail.  Pain in both feet - He was  previously diagnosed with Achilles tendinitis about 1 year ago.  His symptoms improved with extended heel lifts at the gym.   On examination he had no tenderness or active inflammation along the Achilles tendon.  No evidence of plantar fasciitis.  Small dorsal spurs noted on both feet.  Some PIP and DIP thickening consistent with early osteoarthritic changes were noted.  No signs of dactylitis noted.  X-rays of both feet were obtained today along with the following lab work.  Plan: XR Foot 2 Views Left, XR Foot 2 Views Right, COMPLETE METABOLIC PANEL WITH GFR, CBC with Differential/Platelet, Sedimentation rate, Rheumatoid factor, Cyclic citrul peptide antibody, IgG, C-reactive protein, Glucose 6 phosphate dehydrogenase  Achilles tendonitis, bilateral: Long-distance runner.  Evaluated by sports medicine provider in the past.  He was diagnosed with Achilles tendinitis bilaterally about 1 year ago.  His symptoms improved with stretching and strengthening exercises.  On examination today he had no active tenderness or inflammation. He wears proper fitting shoes.    Medication monitoring encounter -CBC, CMP, G6PD were checked today.  Briefly discussed treatment options for management of psoriasis including the possible use of Otezla, sulfasalazine, or Skyrizi in the future.  Plan to hold off on obtaining baseline immunosuppressive labs at this time.  He is apprehensive to take an immunosuppressive agent for management of psoriasis.  Plan: COMPLETE METABOLIC PANEL WITH GFR, CBC with Differential/Platelet, Glucose 6 phosphate dehydrogenase  DDD (degenerative disc disease), lumbar: He had no midline spinal tenderness on examination today.  Other form of scoliosis of lumbar spine: Evaluated at [facility] in the past.  Previously diagnosed with scoliosis of the lumbar spine as well as degeneration of the lumbar intervertebral discs.  He is not currently experiencing any discomfort in his lower back.  He had  no midline spinal tenderness or SI joint tenderness upon palpation.  No symptoms of radiculopathy.  Other medical conditions are listed as follows:   Paroxysmal atrial  fibrillation ([place]) - S/p ablation  Essential hypertension - Well controlled taking Losartan 25 mg daily.   History of hyperlipidemia: Lipid panel was within normal limits on 06/15/2022.   Orders: Orders Placed This Encounter  Procedures   XR Hand 2 View Right   XR Foot 2 Views Left   XR Foot 2 Views Right   XR Hand 2 View Left   COMPLETE METABOLIC PANEL WITH GFR   CBC with Differential/Platelet   Sedimentation rate   Rheumatoid factor   Cyclic citrul peptide antibody, IgG   C-reactive protein   Glucose 6 phosphate dehydrogenase   No orders of the defined types were placed in this encounter.    Follow-Up Instructions: Return for NPFU.   Ofilia Neas, PA-C  Note - This record has been created using Dragon software.  Chart creation errors have been sought, but may not always  have been located. Such creation errors do not reflect on  the standard of medical care.

## 2022-07-12 ENCOUNTER — Other Ambulatory Visit: Payer: Self-pay

## 2022-07-12 DIAGNOSIS — R7989 Other specified abnormal findings of blood chemistry: Secondary | ICD-10-CM

## 2022-07-14 ENCOUNTER — Ambulatory Visit: Payer: BC Managed Care – PPO

## 2022-07-14 ENCOUNTER — Ambulatory Visit (INDEPENDENT_AMBULATORY_CARE_PROVIDER_SITE_OTHER): Payer: BC Managed Care – PPO

## 2022-07-14 ENCOUNTER — Encounter: Payer: Self-pay | Admitting: Physician Assistant

## 2022-07-14 ENCOUNTER — Ambulatory Visit: Payer: BC Managed Care – PPO | Attending: Physician Assistant | Admitting: Physician Assistant

## 2022-07-14 VITALS — BP 129/68 | HR 74 | Resp 16 | Ht 74.0 in | Wt 174.0 lb

## 2022-07-14 DIAGNOSIS — Z8639 Personal history of other endocrine, nutritional and metabolic disease: Secondary | ICD-10-CM

## 2022-07-14 DIAGNOSIS — M129 Arthropathy, unspecified: Secondary | ICD-10-CM | POA: Diagnosis not present

## 2022-07-14 DIAGNOSIS — M79641 Pain in right hand: Secondary | ICD-10-CM

## 2022-07-14 DIAGNOSIS — L409 Psoriasis, unspecified: Secondary | ICD-10-CM | POA: Diagnosis not present

## 2022-07-14 DIAGNOSIS — M79671 Pain in right foot: Secondary | ICD-10-CM | POA: Diagnosis not present

## 2022-07-14 DIAGNOSIS — M79642 Pain in left hand: Secondary | ICD-10-CM | POA: Diagnosis not present

## 2022-07-14 DIAGNOSIS — M7662 Achilles tendinitis, left leg: Secondary | ICD-10-CM

## 2022-07-14 DIAGNOSIS — M5136 Other intervertebral disc degeneration, lumbar region: Secondary | ICD-10-CM

## 2022-07-14 DIAGNOSIS — I480 Paroxysmal atrial fibrillation: Secondary | ICD-10-CM | POA: Diagnosis not present

## 2022-07-14 DIAGNOSIS — M79672 Pain in left foot: Secondary | ICD-10-CM

## 2022-07-14 DIAGNOSIS — I10 Essential (primary) hypertension: Secondary | ICD-10-CM

## 2022-07-14 DIAGNOSIS — M51369 Other intervertebral disc degeneration, lumbar region without mention of lumbar back pain or lower extremity pain: Secondary | ICD-10-CM

## 2022-07-14 DIAGNOSIS — M4186 Other forms of scoliosis, lumbar region: Secondary | ICD-10-CM

## 2022-07-14 DIAGNOSIS — M7661 Achilles tendinitis, right leg: Secondary | ICD-10-CM

## 2022-07-14 DIAGNOSIS — Z5181 Encounter for therapeutic drug level monitoring: Secondary | ICD-10-CM

## 2022-07-19 LAB — COMPLETE METABOLIC PANEL WITH GFR
AG Ratio: 2 (calc) (ref 1.0–2.5)
ALT: 25 U/L (ref 9–46)
AST: 18 U/L (ref 10–35)
Albumin: 4.5 g/dL (ref 3.6–5.1)
Alkaline phosphatase (APISO): 63 U/L (ref 35–144)
BUN: 17 mg/dL (ref 7–25)
CO2: 31 mmol/L (ref 20–32)
Calcium: 9.6 mg/dL (ref 8.6–10.3)
Chloride: 104 mmol/L (ref 98–110)
Creat: 0.77 mg/dL (ref 0.70–1.30)
Globulin: 2.2 g/dL (calc) (ref 1.9–3.7)
Glucose, Bld: 90 mg/dL (ref 65–99)
Potassium: 4.4 mmol/L (ref 3.5–5.3)
Sodium: 141 mmol/L (ref 135–146)
Total Bilirubin: 0.6 mg/dL (ref 0.2–1.2)
Total Protein: 6.7 g/dL (ref 6.1–8.1)
eGFR: 109 mL/min/{1.73_m2} (ref >=60)

## 2022-07-19 LAB — CBC WITH DIFFERENTIAL/PLATELET
Absolute Monocytes: 353 cells/uL (ref 200–950)
Basophils Absolute: 22 cells/uL (ref 0–200)
Basophils Relative: 0.6 %
Eosinophils Absolute: 112 cells/uL (ref 15–500)
Eosinophils Relative: 3.1 %
HCT: 40 % (ref 38.5–50.0)
Hemoglobin: 14.1 g/dL (ref 13.2–17.1)
Lymphs Abs: 1631 cells/uL (ref 850–3900)
MCH: 33.1 pg — ABNORMAL HIGH (ref 27.0–33.0)
MCHC: 35.3 g/dL (ref 32.0–36.0)
MCV: 93.9 fL (ref 80.0–100.0)
MPV: 10.3 fL (ref 7.5–12.5)
Monocytes Relative: 9.8 %
Neutro Abs: 1483 cells/uL — ABNORMAL LOW (ref 1500–7800)
Neutrophils Relative %: 41.2 %
Platelets: 187 10*3/uL (ref 140–400)
RBC: 4.26 10*6/uL (ref 4.20–5.80)
RDW: 12.3 % (ref 11.0–15.0)
Total Lymphocyte: 45.3 %
WBC: 3.6 10*3/uL — ABNORMAL LOW (ref 3.8–10.8)

## 2022-07-19 LAB — C-REACTIVE PROTEIN: CRP: 0.4 mg/L (ref <8.0)

## 2022-07-19 LAB — SEDIMENTATION RATE: Sed Rate: 14 mm/h (ref 0–15)

## 2022-07-19 LAB — CYCLIC CITRUL PEPTIDE ANTIBODY, IGG: Cyclic Citrullin Peptide Ab: <16 UNITS

## 2022-07-19 LAB — GLUCOSE 6 PHOSPHATE DEHYDROGENASE: G-6PDH: 14.3 U/g Hgb (ref 7.0–20.5)

## 2022-07-19 LAB — RHEUMATOID FACTOR: Rheumatoid fact SerPl-aCnc: 17 IU/mL — ABNORMAL HIGH (ref <14)

## 2022-07-28 NOTE — Progress Notes (Signed)
Office Visit Note  Patient: Johnathan Walker             Date of Birth: July 16, 1970           MRN: [id number]             PCP: Vivi Barrack, MD Referring: Vivi Barrack, MD Visit Date: 08/11/2022 Occupation: '@GUAROCC'$ @  Subjective:  Foot swelling  History of Present Illness: Cloyd Volcy is a 52 y.o. male with history of psoriasis and joint pain.  He states he has been having intermittent swelling of the right foot.  He had an episode last week which is gradually improving.  He states his right first toe was warm and red.  Sometimes he had discomfort and swelling in the midfoot.  He has had history of recurrent Achilles tendinitis.  He continues to have some discomfort in his right shoulder and some discomfort in his heels.  He denies any history of Planter fasciitis or sacroiliitis.  There is no history of uveitis or shortness of breath.  He is very active and runs daily.    Activities of Daily Living:  Patient reports morning stiffness for 0 minutes.   Patient Denies nocturnal pain.  Difficulty dressing/grooming: Denies Difficulty climbing stairs: Denies Difficulty getting out of chair: Denies Difficulty using hands for taps, buttons, cutlery, and/or writing: Denies  Review of Systems  Constitutional: Negative.  Negative for fatigue.  HENT: Negative.  Negative for mouth sores and mouth dryness.   Eyes: Negative.  Negative for dryness.  Respiratory: Negative.  Negative for shortness of breath.   Cardiovascular: Negative.  Negative for chest pain and palpitations.  Gastrointestinal: Negative.  Negative for blood in stool, constipation and diarrhea.  Endocrine: Negative.  Negative for increased urination.  Genitourinary: Negative.  Negative for involuntary urination.  Musculoskeletal:  Positive for joint pain and joint pain. Negative for gait problem, joint swelling, myalgias, muscle weakness, morning stiffness, muscle tenderness and myalgias.  Skin:  Positive for rash.  Negative for pallor, hair loss and sensitivity to sunlight.  Allergic/Immunologic: Negative.  Negative for susceptible to infections.  Neurological: Negative.  Negative for dizziness and headaches.  Hematological: Negative.  Negative for swollen glands.  Psychiatric/Behavioral: Negative.  Negative for depressed mood and sleep disturbance. The patient is not nervous/anxious.     PMFS History:  Patient Active Problem List   Diagnosis Date Noted   Arthropathy 05/20/2022   Chronic low back pain 11/18/2020   Psoriasis 11/18/2020   AF (atrial fibrillation) (HCC) s/p ablation 03/17/2017   Essential hypertension 11/16/2016    Past Medical History:  Diagnosis Date   Atrial fibrillation with rapid ventricular response ([place]) 11/16/2016   Essential hypertension 11/16/2016   Hypercholesteremia    Hypertension    New onset atrial fibrillation ([place]) 11/15/2016    Family History  Problem Relation Age of Onset   Atrial fibrillation Mother    Diabetes Father    Hypertension Father    Lung cancer Father    Past Surgical History:  Procedure Laterality Date   ATRIAL FIBRILLATION ABLATION  03/17/2017   ATRIAL FIBRILLATION ABLATION N/A 03/17/2017   Procedure: Atrial Fibrillation Ablation;  Surgeon: Constance Haw, MD;  Location: [facility];  Service: Cardiovascular;  Laterality: N/A;   Social History   Social History Narrative   Not on file   Immunization History  Administered Date(s) Administered   Td 04/11/2003   Tdap 07/03/2015     Objective: Vital Signs: BP 122/77 (BP Location: Left  Arm, Patient Position: Sitting, Cuff Size: Large)   Pulse 64   Resp 16   Ht '6\' 2"'$  (1.88 m)   Wt 176 lb (79.8 kg)   BMI 22.60 kg/m    Physical Exam Vitals and nursing note reviewed.  Constitutional:      Appearance: He is well-developed.  HENT:     Head: Normocephalic and atraumatic.  Eyes:     Conjunctiva/sclera: Conjunctivae normal.     Pupils: Pupils are equal, round, and  reactive to light.  Cardiovascular:     Rate and Rhythm: Normal rate and regular rhythm.     Heart sounds: Normal heart sounds.  Pulmonary:     Effort: Pulmonary effort is normal.     Breath sounds: Normal breath sounds.  Abdominal:     General: Bowel sounds are normal.     Palpations: Abdomen is soft.  Musculoskeletal:     Cervical back: Normal range of motion and neck supple.  Skin:    General: Skin is warm and dry.     Capillary Refill: Capillary refill takes less than 2 seconds.     Comments: Psoriasis patches were noted on his scalp and right forearm.  Neurological:     Mental Status: He is alert and oriented to person, place, and time.  Psychiatric:        Behavior: Behavior normal.      Musculoskeletal Exam: Cervical, thoracic and lumbar spine were in good range of motion.  He was unable to reach his toes due to tight hamstrings.  He had no SI joint tenderness.  Shoulders were in good range of motion with some discomfort on range of motion of his right shoulder joint.  Elbow joints, wrist joints, MCPs PIPs and DIPs Juengel range of motion with no synovitis.  There was no lateral epicondylitis or medial epicondylitis.  Hip joints and knee joints were in good range of motion without any warmth swelling or effusion.  No patellar tendinitis was noted.  Ankle joints were in good range of motion.  There was no tenderness over Achilles tendon or plantar fascia.  He had warmth and swelling of his right first MTP joint.  PIP and DIP thickening with no synovitis was noted.  CDAI Exam: CDAI Score: -- Patient Global: --; Provider Global: -- Swollen: --; Tender: -- Joint Exam 08/11/2022   No joint exam has been documented for this visit   There is currently no information documented on the homunculus. Go to the Rheumatology activity and complete the homunculus joint exam.  Investigation: No additional findings.  Imaging: XR Foot 2 Views Right  Result Date: 07/14/2022 First MTP  severe narrowing was noted.  PIP and DIP narrowing was noted.  No intertarsal, tibiotalar or subtalar joint space narrowing was noted.  Calcaneal spur was noted.  No erosive changes were noted. Impression: These findings are consistent with osteoarthritis of the foot.  XR Foot 2 Views Left  Result Date: 07/14/2022 PIP and DIP narrowing was noted.  No MTP, intertarsal, tibiotalar or subtalar joint space narrowing was noted.  No erosive changes were noted. Impression: Unremarkable x-rays of the left foot.  XR Hand 2 View Left  Result Date: 07/14/2022 Mild CMC, first MCP, PIP and DIP narrowing was noted.  No other MCP, intercarpal or radiocarpal joint space narrowing was noted.  No erosive changes were noted. Impression: These findings are consistent with osteoarthritis of the hand.  XR Hand 2 View Right  Result Date: 07/14/2022 [facility], first MCP narrowing was  noted.  Mild PIP and DIP narrowing was noted.  No MCP, intercarpal or radiocarpal joint space narrowing was noted.  No erosive changes were noted. Impression: These findings are consistent with osteoarthritis of the hand.   Recent Labs: Lab Results  Component Value Date   WBC 3.6 (L) 07/14/2022   HGB 14.1 07/14/2022   PLT 187 07/14/2022   NA 141 07/14/2022   K 4.4 07/14/2022   CL 104 07/14/2022   CO2 31 07/14/2022   GLUCOSE 90 07/14/2022   BUN 17 07/14/2022   CREATININE 0.77 07/14/2022   BILITOT 0.6 07/14/2022   ALKPHOS 60 06/15/2022   AST 18 07/14/2022   ALT 25 07/14/2022   PROT 6.7 07/14/2022   ALBUMIN 4.4 06/15/2022   CALCIUM 9.6 07/14/2022   GFRAA 121 03/07/2017   July 14, 2022 ESR 14, CRP 0.3, RF 17, anti-CCP negative, G6PD normal  Speciality Comments: No specialty comments available.  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  He also had discomfort in his right midfoot in the past.  There is history of bilateral Achilles tendinitis.  He denies any history of dactylitis, Planter fasciitis or uveitis.  He had no SI joint tenderness.  We had detailed discussion regarding possible psoriatic arthritis and the treatment options were discussed.  Medication side effects contraindications were discussed patient wanted to proceed with Rutherford Nail.  He has history of chronic neutropenia and we will avoid methotrexate.  A handout was given and consent was taken on [place].  Will apply for Otezla.  I also discussed possible use of Skyrizi if he has intolerance to [place].  Counseled patient that Rutherford Nail is a PDE 4 inhibitor that works to treat psoriasis and the joint pain and tenderness of psoriatic arthritis.  Counseled patient on purpose, proper use, and adverse effects of Otezla.  Reviewed the most common adverse effects of weight loss, depression, nausea/diarrhea/vomiting, headaches, and nasal congestion.  Advised patient to notify office of any serious changes in mood and/or thoughts of suicide.  Provided patient with medication education material and answered all questions.  Patient consented to [place].  Patient denies history of depression or suicidal ideation.  Patient dose will be Otezla starter titration pack and then 30 mg twice daily.  Prescription pending insurance approval and once approved patient may pick up sample for starter pack from our office.   Psoriasis - Diagnosed 2020 by Dr. Deatra Ina.  He has been using over-the-counter products which are not helping.  Rheumatoid factor positive-he had borderline positive rheumatoid factor.  We will continue to monitor over time.  Anti-CCP was negative and sed rate was normal.  Pain in both hands - History of a stiffness for the last 10 years.  No history of dactylitis.  X-rays were unremarkable.  X-ray findings were  reviewed with the patient.  Pain in both feet -he complains of pain and discomfort in his right foot.  Synovitis was noted in the right first MTP joint.  Early osteoarthritic changes were noted on the clinical examination although x-rays were unremarkable.  I will obtain uric acid with his next labs.  Achilles tendonitis, bilateral - History of bilateral Achilles tendinitis about a year ago.  He runs long distance.  Followed by sports medicine.  DDD (degenerative disc disease),  lumbar-no spinal tenderness was noted.  Other form of scoliosis of lumbar spine-he was evaluated by [facility].  Essential hypertension - Patient is on losartan.  Blood pressure was normal at 122/77 today.  Paroxysmal atrial fibrillation (HCC) - S/p ablation.  History of hyperlipidemia  Orders: No orders of the defined types were placed in this encounter.  No orders of the defined types were placed in this encounter.    Follow-Up Instructions: Return in about 6 weeks (around 09/22/2022) for Psoriatic arthritis.   Bo Merino, MD  Note - This record has been created using Editor, commissioning.  Chart creation errors have been sought, but may not always  have been located. Such creation errors do not reflect on  the standard of medical care.

## 2022-08-11 ENCOUNTER — Encounter: Payer: Self-pay | Admitting: Rheumatology

## 2022-08-11 ENCOUNTER — Ambulatory Visit: Payer: BC Managed Care – PPO | Attending: Rheumatology | Admitting: Rheumatology

## 2022-08-11 VITALS — BP 122/77 | HR 64 | Resp 16 | Ht 74.0 in | Wt 176.0 lb

## 2022-08-11 DIAGNOSIS — R768 Other specified abnormal immunological findings in serum: Secondary | ICD-10-CM

## 2022-08-11 DIAGNOSIS — M7662 Achilles tendinitis, left leg: Secondary | ICD-10-CM

## 2022-08-11 DIAGNOSIS — I10 Essential (primary) hypertension: Secondary | ICD-10-CM

## 2022-08-11 DIAGNOSIS — R7689 Other specified abnormal immunological findings in serum: Secondary | ICD-10-CM

## 2022-08-11 DIAGNOSIS — M79671 Pain in right foot: Secondary | ICD-10-CM | POA: Diagnosis not present

## 2022-08-11 DIAGNOSIS — M51369 Other intervertebral disc degeneration, lumbar region without mention of lumbar back pain or lower extremity pain: Secondary | ICD-10-CM

## 2022-08-11 DIAGNOSIS — M7661 Achilles tendinitis, right leg: Secondary | ICD-10-CM

## 2022-08-11 DIAGNOSIS — M4186 Other forms of scoliosis, lumbar region: Secondary | ICD-10-CM

## 2022-08-11 DIAGNOSIS — M5136 Other intervertebral disc degeneration, lumbar region: Secondary | ICD-10-CM

## 2022-08-11 DIAGNOSIS — L409 Psoriasis, unspecified: Secondary | ICD-10-CM

## 2022-08-11 DIAGNOSIS — M79642 Pain in left hand: Secondary | ICD-10-CM

## 2022-08-11 DIAGNOSIS — I480 Paroxysmal atrial fibrillation: Secondary | ICD-10-CM

## 2022-08-11 DIAGNOSIS — M79641 Pain in right hand: Secondary | ICD-10-CM

## 2022-08-11 DIAGNOSIS — M79672 Pain in left foot: Secondary | ICD-10-CM

## 2022-08-11 DIAGNOSIS — Z8639 Personal history of other endocrine, nutritional and metabolic disease: Secondary | ICD-10-CM

## 2022-08-11 DIAGNOSIS — L4050 Arthropathic psoriasis, unspecified: Secondary | ICD-10-CM

## 2022-08-11 NOTE — Patient Instructions (Signed)
Apremilast Tablets What is this medication? APREMILAST (a PRE mil ast) treats arthritis and psoriasis. It may also be used to treat mouth ulcers in people with a condition that causes blood vessel swelling (Behcet syndrome). It works by decreasing inflammation. This medicine may be used for other purposes; ask your health care provider or pharmacist if you have questions. COMMON BRAND NAME(S): Otezla What should I tell my care team before I take this medication? They need to know if you have any of these conditions: Dehydration Kidney disease Mental illness An unusual or allergic reaction to apremilast, other medications, foods, dyes, or preservatives Pregnant or trying to get pregnant Breast-feeding How should I use this medication? Take this medication by mouth with a glass of water. Follow the directions on the prescription label. Do not cut, crush or chew this medication. You can take it with or without food. If it upsets your stomach, take it with food. Take your medication at regular intervals. Do not take it more often than directed. Do not stop taking except on your care team's advice. Talk to your care team about the use of this medication in children. Special care may be needed. Overdosage: If you think you have taken too much of this medicine contact a poison control center or emergency room at once. NOTE: This medicine is only for you. Do not share this medicine with others. What if I miss a dose? If you miss a dose, take it as soon as you can. If it is almost time for your next dose, take only that dose. Do not take double or extra doses. What may interact with this medication? Certain medications for seizures like carbamazepine, phenobarbital, phenytoin Rifampin This list may not describe all possible interactions. Give your health care provider a list of all the medicines, herbs, non-prescription drugs, or dietary supplements you use. Also tell them if you smoke, drink alcohol,  or use illegal drugs. Some items may interact with your medicine. What should I watch for while using this medication? Tell your care team if your symptoms do not start to get better or if they get worse. Patients and their families should watch out for new or worsening depression or thoughts of suicide. Also watch out for sudden changes in feelings such as feeling anxious, agitated, panicky, irritable, hostile, aggressive, impulsive, severely restless, overly excited and hyperactive, or not being able to sleep. If this happens, call your care team. Check with your care team if you get an attack of severe diarrhea, nausea and vomiting, or if you sweat a lot. The loss of too much body fluid can make it dangerous for you to take this medication. What side effects may I notice from receiving this medication? Side effects that you should report to your care team as soon as possible: Allergic reactions--skin rash, itching, hives, swelling of the face, lips, tongue, or throat Worsening mood, feelings of depression Side effects that usually do not require medical attention (report to your care team if they continue or are bothersome): Diarrhea Headache Loss of appetite with weight loss Nausea Vomiting This list may not describe all possible side effects. Call your doctor for medical advice about side effects. You may report side effects to FDA at 1-800-FDA-1088. Where should I keep my medication? Keep out of the reach of children. Store below 30 degrees C (86 degrees F). Throw away any unused medication after the expiration date. NOTE: This sheet is a summary. It may not cover all possible information. If   you have questions about this medicine, talk to your doctor, pharmacist, or health care provider.  2023 Elsevier/Gold Standard (2021-01-21 00:00:00)  

## 2022-08-12 ENCOUNTER — Telehealth: Payer: Self-pay | Admitting: Pharmacist

## 2022-08-12 DIAGNOSIS — L409 Psoriasis, unspecified: Secondary | ICD-10-CM

## 2022-08-12 DIAGNOSIS — L4050 Arthropathic psoriasis, unspecified: Secondary | ICD-10-CM

## 2022-08-12 NOTE — Telephone Encounter (Addendum)
Submitted a Prior Authorization request to [facility] for OTEZLA via CoverMyMeds. Will update once we receive a response.   Key: [id number]  Knox Saliva, PharmD, MPH, BCPS, CPP Clinical Pharmacist (Rheumatology and Pulmonology)  ----- Message from Shona Needles, RT sent at 08/11/2022  4:34 PM EST ----- Regarding: NEW START OTEZLA

## 2022-08-16 NOTE — Telephone Encounter (Signed)
Received a fax regarding Prior Authorization from [facility] for [place]. Authorization has been DENIED because patient must try and fail and have ben unable to tolerated at least one standard medication (cyclosporine, leflunomide, MTX, SSZ) for at least 3 months OR patient must have medical reason why they cannot take all of the above standard medications  Rutherford Nail [employer] forms completed today for bridge program enrollment form and faxed to company Fax: [phone] Phone: [phone]  ATC patient - unable to reach. Left VM requesting return call.  Will need to work on appeal during bridge program process.  Knox Saliva, PharmD, MPH, BCPS, CPP Clinical Pharmacist (Rheumatology and Pulmonology)

## 2022-08-18 ENCOUNTER — Other Ambulatory Visit (HOSPITAL_COMMUNITY): Payer: Self-pay

## 2022-08-18 ENCOUNTER — Telehealth: Payer: Self-pay

## 2022-08-18 MED ORDER — OTEZLA 30 MG PO TABS: 30.0000 mg | ORAL_TABLET | Freq: Two times a day (BID) | ORAL | 3 refills | Status: DC

## 2022-08-18 MED ORDER — OTEZLA 10 & 20 & 30 MG PO TBPK: ORAL_TABLET | ORAL | 0 refills | Status: DC

## 2022-08-18 NOTE — Telephone Encounter (Addendum)
Received fax from [facility] that PA is required and rx was triaged to [facility] despite PA denial being attached. Called [employer] - rep apologized that PA denial letter was not looked at  and advised that she can triage to [facility] today.  [employer] Support Case # [id number] Phone: [phone]  Patient has to call in to complete enrollment: [phone], Mon-Fri 8am-8pm EST - enroll into bridge program. I spoke with patient regarding completion of enrollment. He initially stated he was unsure if he wanted to start [facility] because he read that it can cause insomnia. I advised that this is unlikely. Reviewed most common and/or concerning side effects:  weight loss, depression, nausea/diarrhea/vomiting, headaches, and nasal congestion.  Advised patient to notify office of any serious changes in mood and/or thoughts of suicide. He denies baseline diagnosis of depression. After discussion, he is in agreement to trial of [place].  Rx sent to [facility] today for starter dose and maintenance dose per [employer] rep request.  Knox Saliva, PharmD, MPH, BCPS, CPP Clinical Pharmacist (Rheumatology and Pulmonology)

## 2022-08-18 NOTE — Telephone Encounter (Signed)
Patient contacted the office to inquire which pharmacy the [facility] prescription was sent to. Advised patient the Rutherford Nail was sent to [facility]. Patient states he contacted the pharmacy. The pharmacy states they did not receive the prescription. After reviewing the chart, advised patient the pharmacy sent an receipt stating they got the [facility] prescription. Patient inquired about the pharmacies phone number. Advised patient of the pharmacies phone number. Patient states he is going to call the pharmacy.

## 2022-08-25 DIAGNOSIS — M545 Low back pain, unspecified: Secondary | ICD-10-CM | POA: Diagnosis not present

## 2022-08-25 NOTE — Telephone Encounter (Signed)
Received fax from [facility] stating that patient's Rutherford Nail rx requires clinical notes and prior therapy.  This has been faxed to [facility] today  Fax: [phone] Phone: [phone]  Knox Saliva, PharmD, MPH, BCPS, CPP Clinical Pharmacist (Rheumatology and Pulmonology)

## 2022-08-31 ENCOUNTER — Other Ambulatory Visit: Payer: Self-pay | Admitting: Family Medicine

## 2022-08-31 MED ORDER — LOSARTAN POTASSIUM 50 MG PO TABS: 25.0000 mg | ORAL_TABLET | Freq: Every day | ORAL | 3 refills | Status: DC

## 2022-08-31 NOTE — Telephone Encounter (Signed)
  Encourage patient to contact the pharmacy for refills or they can request refills through [place]:  06/15/2022  NEXT APPOINTMENT DATE:06/17/2023   MEDICATION:losartan (COZAAR) 50 MG tablet   Is the patient out of medication?  No  PHARMACY: [place], [place]    Let patient know to contact pharmacy at the end of the day to make sure medication is ready.  Please notify patient to allow 48-72 hours to process

## 2022-08-31 NOTE — Telephone Encounter (Signed)
Last prescribed by historical provider  

## 2022-09-02 DIAGNOSIS — R3912 Poor urinary stream: Secondary | ICD-10-CM | POA: Diagnosis not present

## 2022-09-02 DIAGNOSIS — E291 Testicular hypofunction: Secondary | ICD-10-CM | POA: Diagnosis not present

## 2022-09-07 DIAGNOSIS — E291 Testicular hypofunction: Secondary | ICD-10-CM | POA: Diagnosis not present

## 2022-09-07 DIAGNOSIS — R948 Abnormal results of function studies of other organs and systems: Secondary | ICD-10-CM | POA: Diagnosis not present

## 2022-09-15 NOTE — Telephone Encounter (Signed)
Called [facility] to determine if patient's rx for [place] has been delivered to patient. Per rep nothing has gone out. Clinicals that were faxed were not tagged to patient's case.  Phone: [phone]  Per [employer], patient is enrolled into bridge program. He needs to provide the copay card information to pharmacy. This was emailed to him and a physical copy was mailed to him  Copay card information that needs to be processed once rx claim is rejected by insurance: BIN: [id number] Group: [id number] PCN: [id number] ID: [id number]  ATC patient to discuss . Unable to reach-  phone went straight to VM. Left VM requesting return call tomorrow morning  Chesley Mires, PharmD, MPH, BCPS, CPP Clinical Pharmacist (Rheumatology and Pulmonology)

## 2022-09-15 NOTE — Telephone Encounter (Signed)
Received fax from [facility] stating that additional clinical pharmacist reviewed patient's [facility] authorization and denial remains in place. Request does not meet definition of medical necessity.  Patient must try and fail and have ben unable to tolerated at least one standard medication (cyclosporine, leflunomide, MTX, SSZ) for at least 3 months OR patient must have medical reason why they cannot take all of the above standard medications   Chesley Mires, PharmD, MPH, BCPS, CPP Clinical Pharmacist (Rheumatology and Pulmonology)

## 2022-09-17 ENCOUNTER — Telehealth: Payer: Self-pay | Admitting: Rheumatology

## 2022-09-17 DIAGNOSIS — L409 Psoriasis, unspecified: Secondary | ICD-10-CM

## 2022-09-17 NOTE — Telephone Encounter (Signed)
Patient called the office to cancel an upcoming appointment in May stating he was supposed to have been referred to dermatology but never heard anything. Patient would like to know where he was referred. Patient states he believes seeing derm is more important at this time. Please advise.

## 2022-09-17 NOTE — Telephone Encounter (Signed)
Attempted to contact patient and left message to advise patient to call the office.  

## 2022-09-21 NOTE — Telephone Encounter (Signed)
Attempted to contact the patient and left message for patient to call the office.  

## 2022-09-21 NOTE — Telephone Encounter (Signed)
Patient returned call to the office. Patient states he does not feel that his condition warrants the use of Otezla. Patient has decided not to take [place]. Patient is requesting a referral to dermatology. Patient advised that dermatology is scheduling about 1 year out. Patient states he does not feel he needs to be seen urgently.

## 2022-09-21 NOTE — Telephone Encounter (Signed)
Please place nonurgent referral to dermatology per patient's request.

## 2022-09-21 NOTE — Telephone Encounter (Signed)
Referral placed.

## 2022-09-21 NOTE — Addendum Note (Signed)
Addended by: Henriette Combs on: 09/21/2022 04:54 PM   Modules accepted: Orders

## 2022-09-22 NOTE — Telephone Encounter (Signed)
Per chart review, patient has decided not to take [place]. He would like referral to dermatology. Closing encounter.  Chesley Mires, PharmD, MPH, BCPS, CPP Clinical Pharmacist (Rheumatology and Pulmonology)

## 2022-10-07 ENCOUNTER — Ambulatory Visit: Payer: BC Managed Care – PPO | Admitting: Rheumatology

## 2022-10-19 DIAGNOSIS — E291 Testicular hypofunction: Secondary | ICD-10-CM | POA: Diagnosis not present

## 2022-10-19 DIAGNOSIS — R948 Abnormal results of function studies of other organs and systems: Secondary | ICD-10-CM | POA: Diagnosis not present

## 2022-10-19 LAB — TESTOSTERONE: Testosterone: 311.5

## 2022-10-19 LAB — PSA: PSA: 0.92

## 2022-12-27 DIAGNOSIS — E291 Testicular hypofunction: Secondary | ICD-10-CM | POA: Diagnosis not present

## 2022-12-27 DIAGNOSIS — R3912 Poor urinary stream: Secondary | ICD-10-CM | POA: Diagnosis not present

## 2022-12-29 ENCOUNTER — Encounter: Payer: Self-pay | Admitting: Family Medicine

## 2023-01-27 DIAGNOSIS — L570 Actinic keratosis: Secondary | ICD-10-CM | POA: Diagnosis not present

## 2023-02-11 ENCOUNTER — Ambulatory Visit: Payer: BC Managed Care – PPO | Admitting: Cardiology

## 2023-03-21 DIAGNOSIS — T50905A Adverse effect of unspecified drugs, medicaments and biological substances, initial encounter: Secondary | ICD-10-CM | POA: Diagnosis not present

## 2023-03-21 DIAGNOSIS — L570 Actinic keratosis: Secondary | ICD-10-CM | POA: Diagnosis not present

## 2023-03-22 NOTE — Progress Notes (Unsigned)
  Electrophysiology Office Note:   Date:  03/23/2023  ID:  Johnathan Walker, DOB June 22, 1970, MRN [id number]  Primary Cardiologist: None Electrophysiologist: Will Jorja Loa, MD      History of Present Illness:   Johnathan Walker is a 52 y.o. male with h/o PAF s/p ablation 03/2017 and HTN seen today for routine electrophysiology followup.   Since last being seen in our clinic the patient reports doing very well.  he denies chest pain, palpitations, dyspnea, PND, orthopnea, nausea, vomiting, dizziness, syncope, edema, weight gain, or early satiety.   Review of systems complete and found to be negative unless listed in HPI.   EP Information / Studies Reviewed:    EKG is ordered today. Personal review as below.  EKG Interpretation Date/Time:  Wednesday March 23 2023 09:30:40 EDT Ventricular Rate:  71 PR Interval:  160 QRS Duration:  100 QT Interval:  410 QTC Calculation: 445 R Axis:   210  Text Interpretation: Normal sinus rhythm Right superior axis deviation Incomplete right bundle branch block Right ventricular hypertrophy Confirmed by Maxine Glenn [phone]) on 03/23/2023 9:32:40 AM    Arrhythmia History  S/p AF ablation 03/2017  Echo 11/2016 LVEF 55-60%  Physical Exam:   VS:  BP 126/80   Pulse 76   Ht 6\' 2"  (1.88 m)   Wt 176 lb (79.8 kg)   SpO2 98%   BMI 22.60 kg/m    Wt Readings from Last 3 Encounters:  03/23/23 176 lb (79.8 kg)  08/11/22 176 lb (79.8 kg)  07/14/22 174 lb (78.9 kg)     GEN: Well nourished, well developed in no acute distress NECK: No JVD; No carotid bruits CARDIAC: Regular rate and rhythm, no murmurs, rubs, gallops RESPIRATORY:  Clear to auscultation without rales, wheezing or rhonchi  ABDOMEN: Soft, non-tender, non-distended EXTREMITIES:  No edema; No deformity   ASSESSMENT AND PLAN:    Paroxysmal Atrial Fibrillation  S/p Ablation 2018 EKG today shows NSR   Off OAC with CHA2DS2VASc of 1, consider if risk factors change or if has  additional AF  HTN Stable on current regimen    Follow up with Dr. Elberta Fortis in 12 months  Signed, Graciella Freer, PA-C

## 2023-03-23 ENCOUNTER — Encounter: Payer: Self-pay | Admitting: Student

## 2023-03-23 ENCOUNTER — Ambulatory Visit: Payer: BC Managed Care – PPO | Attending: Cardiology | Admitting: Student

## 2023-03-23 VITALS — BP 126/80 | HR 76 | Ht 74.0 in | Wt 176.0 lb

## 2023-03-23 DIAGNOSIS — I10 Essential (primary) hypertension: Secondary | ICD-10-CM

## 2023-03-23 DIAGNOSIS — I480 Paroxysmal atrial fibrillation: Secondary | ICD-10-CM | POA: Diagnosis not present

## 2023-03-23 NOTE — Patient Instructions (Signed)
Medication Instructions:  Your physician recommends that you continue on your current medications as directed. Please refer to the Current Medication list given to you today.  *If you need a refill on your cardiac medications before your next appointment, please call your pharmacy*  Lab Work: None ordered If you have labs (blood work) drawn today and your tests are completely normal, you will receive your results only by: MyChart Message (if you have MyChart) OR A paper copy in the mail If you have any lab test that is abnormal or we need to change your treatment, we will call you to review the results.  Follow-Up: At [facility], you and your health needs are our priority.  As part of our continuing mission to provide you with exceptional heart care, we have created designated Provider Care Teams.  These Care Teams include your primary Cardiologist (physician) and Advanced Practice Providers (APPs -  Physician Assistants and Nurse Practitioners) who all work together to provide you with the care you need, when you need it.  Your next appointment:   1 year(s)  Provider:   Loman Brooklyn, MD

## 2023-06-10 ENCOUNTER — Other Ambulatory Visit: Payer: Self-pay | Admitting: Family Medicine

## 2023-06-17 ENCOUNTER — Ambulatory Visit (INDEPENDENT_AMBULATORY_CARE_PROVIDER_SITE_OTHER): Payer: BC Managed Care – PPO | Admitting: Family Medicine

## 2023-06-17 ENCOUNTER — Encounter: Payer: Self-pay | Admitting: Family Medicine

## 2023-06-17 VITALS — BP 120/84 | HR 62 | Temp 97.9°F | Ht 74.0 in | Wt 172.6 lb

## 2023-06-17 DIAGNOSIS — Z131 Encounter for screening for diabetes mellitus: Secondary | ICD-10-CM

## 2023-06-17 DIAGNOSIS — Z13220 Encounter for screening for lipoid disorders: Secondary | ICD-10-CM | POA: Diagnosis not present

## 2023-06-17 DIAGNOSIS — M129 Arthropathy, unspecified: Secondary | ICD-10-CM

## 2023-06-17 DIAGNOSIS — I10 Essential (primary) hypertension: Secondary | ICD-10-CM

## 2023-06-17 DIAGNOSIS — Z125 Encounter for screening for malignant neoplasm of prostate: Secondary | ICD-10-CM | POA: Diagnosis not present

## 2023-06-17 DIAGNOSIS — I480 Paroxysmal atrial fibrillation: Secondary | ICD-10-CM | POA: Diagnosis not present

## 2023-06-17 DIAGNOSIS — Z0001 Encounter for general adult medical examination with abnormal findings: Secondary | ICD-10-CM

## 2023-06-17 DIAGNOSIS — R5383 Other fatigue: Secondary | ICD-10-CM

## 2023-06-17 DIAGNOSIS — Z23 Encounter for immunization: Secondary | ICD-10-CM

## 2023-06-17 LAB — HEMOGLOBIN A1C: Hgb A1c MFr Bld: 5.4 % (ref 4.6–6.5)

## 2023-06-17 LAB — CBC
HCT: 43.5 % (ref 39.0–52.0)
Hemoglobin: 14.7 g/dL (ref 13.0–17.0)
MCHC: 33.9 g/dL (ref 30.0–36.0)
MCV: 96.9 fL (ref 78.0–100.0)
Platelets: 173 10*3/uL (ref 150.0–400.0)
RBC: 4.49 Mil/uL (ref 4.22–5.81)
RDW: 12.9 % (ref 11.5–15.5)
WBC: 4.3 10*3/uL (ref 4.0–10.5)

## 2023-06-17 LAB — COMPREHENSIVE METABOLIC PANEL
ALT: 19 U/L (ref 0–53)
AST: 18 U/L (ref 0–37)
Albumin: 4.6 g/dL (ref 3.5–5.2)
Alkaline Phosphatase: 61 U/L (ref 39–117)
BUN: 20 mg/dL (ref 6–23)
CO2: 31 meq/L (ref 19–32)
Calcium: 9.4 mg/dL (ref 8.4–10.5)
Chloride: 105 meq/L (ref 96–112)
Creatinine, Ser: 0.78 mg/dL (ref 0.40–1.50)
GFR: 102.99 mL/min (ref >60.00)
Glucose, Bld: 93 mg/dL (ref 70–99)
Potassium: 4.5 meq/L (ref 3.5–5.1)
Sodium: 143 meq/L (ref 135–145)
Total Bilirubin: 1.2 mg/dL (ref 0.2–1.2)
Total Protein: 6.7 g/dL (ref 6.0–8.3)

## 2023-06-17 LAB — LIPID PANEL
Cholesterol: 182 mg/dL (ref 0–200)
HDL: 70.4 mg/dL (ref >39.00)
LDL Cholesterol: 102 mg/dL — ABNORMAL HIGH (ref 0–99)
NonHDL: 111.92
Total CHOL/HDL Ratio: 3
Triglycerides: 51 mg/dL (ref 0.0–149.0)
VLDL: 10.2 mg/dL (ref 0.0–40.0)

## 2023-06-17 LAB — PSA: PSA: 1.06 ng/mL (ref 0.10–4.00)

## 2023-06-17 LAB — TSH: TSH: 1.71 u[IU]/mL (ref 0.35–5.50)

## 2023-06-17 LAB — TESTOSTERONE: Testosterone: 338.33 ng/dL (ref 300.00–890.00)

## 2023-06-17 MED ORDER — LOSARTAN POTASSIUM 50 MG PO TABS: 25.0000 mg | ORAL_TABLET | Freq: Every day | ORAL | 3 refills | Status: DC

## 2023-06-17 NOTE — Progress Notes (Signed)
 Chief Complaint:  Johnathan Walker is a 53 y.o. male who presents today for his annual comprehensive physical exam.    Assessment/Plan:  Chronic Problems Addressed Today: Arthropathy Still having ongoing issues with this.  Will place referral to sports medicine.  He is interested in PRP injections.  AF (atrial fibrillation) [facility]) s/p ablation Follows with cardiology.  Regular rate and rhythm today.  Essential hypertension Blood pressure at goal on losartan  25 mg daily.  Will refill today.  Check labs.  Preventative Healthcare: Check labs. Shingles vaccine given.    Patient Counseling(The following topics were reviewed and/or handout was given):  -Nutrition: Stressed importance of moderation in sodium/caffeine intake, saturated fat and cholesterol, caloric balance, sufficient intake of fresh fruits, vegetables, and fiber.  -Stressed the importance of regular exercise.   -Substance Abuse: Discussed cessation/primary prevention of tobacco, alcohol, or other drug use; driving or other dangerous activities under the influence; availability of treatment for abuse.   -Injury prevention: Discussed safety belts, safety helmets, smoke detector, smoking near bedding or upholstery.   -Sexuality: Discussed sexually transmitted diseases, partner selection, use of condoms, avoidance of unintended pregnancy and contraceptive alternatives.   -Dental health: Discussed importance of regular tooth brushing, flossing, and dental visits.  -Health maintenance and immunizations reviewed. Please refer to Health maintenance section.  Return to care in 1 year for next preventative visit.     Subjective:  HPI:  He has no acute complaints today.  He is here today for annual physical.  Last seen here a year ago.  Since our last visit, he has seen dermatology, dermatology and cardiology.  Still has ongoing shoulder and joint pain.  He is potentially interested in PRP injection and would like to see a specialist  for this.  He needs refill on losartan  today.  Lifestyle Diet: Balanced. Plenty of fruits and vegetables.  Exercise: Running and going to the gym.      06/17/2023    8:07 AM  Depression screen PHQ 2/9  Decreased Interest 0  Down, Depressed, Hopeless 0  PHQ - 2 Score 0    Health Maintenance Due  Topic Date Due   Zoster Vaccines- Shingrix  (1 of 2) Never done     ROS: Per HPI, otherwise a complete review of systems was negative.   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/20/2022   Chronic low back pain 11/18/2020   Psoriasis 11/18/2020   AF (atrial fibrillation) (HCC) s/p ablation 03/17/2017   Essential hypertension 11/16/2016   Past Surgical History:  Procedure Laterality Date   ATRIAL FIBRILLATION ABLATION  03/17/2017   ATRIAL FIBRILLATION ABLATION N/A 03/17/2017   Procedure: Atrial Fibrillation Ablation;  Surgeon: Inocencio Soyla Lunger, MD;  Location: [facility] INVASIVE CV LAB;  Service: Cardiovascular;  Laterality: N/A;    Family History  Problem Relation Age of Onset   Atrial fibrillation Mother    Diabetes Father    Hypertension Father    Lung cancer Father     Medications- reviewed and updated Current Outpatient Medications  Medication Sig Dispense Refill   Glucosamine 500 MG CAPS Take by mouth.     niacin  (SLO-NIACIN ) 500 MG tablet Take 1,500 mg by mouth at  bedtime.     Omega-3 Fatty Acids (FISH OIL OMEGA-3 PO) Take 2 capsules by mouth.     losartan  (COZAAR ) 50 MG tablet Take 0.5 tablets (25 mg total) by mouth daily. 45 tablet 3   No current facility-administered medications for this visit.    Allergies-reviewed and updated Allergies   Allergen Reactions   Amoxicillin  Diarrhea    Social History   Socioeconomic History   Marital status: Married    Spouse name: Not on file   Number of children: Not on file   Years of education: Not on file   Highest education level: Not on file  Occupational History   Not on file  Tobacco Use   Smoking status: Never    Passive exposure: Never   Smokeless tobacco: Never  Vaping Use   Vaping status: Never Used  Substance and Sexual Activity   Alcohol use: No   Drug use: No   Sexual activity: Not on file  Other Topics Concern   Not on file  Social History Narrative   Not on file   Social Drivers of Health   Financial Resource Strain: Not on file  Food Insecurity: Not on file  Transportation Needs: Not on file  Physical Activity: Not on file  Stress: Not on file  Social Connections: Not on file        Objective:  Physical Exam: BP 120/84   Pulse 62   Temp 97.9 F (36.6 C) (Temporal)   Ht 6' 2 (1.88 m)   Wt 172 lb 9.6 oz (78.3 kg)   SpO2 99%   BMI 22.16 kg/m   Body mass index is 22.16 kg/m. Wt Readings from Last 3 Encounters:  06/17/23 172 lb 9.6 oz (78.3 kg)  03/23/23 176 lb (79.8 kg)  08/11/22 176 lb (79.8 kg)   Gen: NAD, resting comfortably HEENT: TMs normal bilaterally. OP clear. No thyromegaly noted.  CV: RRR with no murmurs appreciated Pulm: NWOB, CTAB with no crackles, wheezes, or rhonchi GI: Normal bowel sounds present. Soft, Nontender, Nondistended. MSK: no edema, cyanosis, or clubbing noted Skin: warm, dry Neuro: CN2-12 grossly intact. Strength 5/5 in upper and lower extremities. Reflexes symmetric and intact bilaterally.  Psych: Normal affect and thought content     Brieana Shimmin M. Kennyth, MD 06/17/2023 8:38 AM

## 2023-06-17 NOTE — Assessment & Plan Note (Signed)
Follows with cardiology.  Regular rate and rhythm today. 

## 2023-06-17 NOTE — Patient Instructions (Signed)
 It was very nice to see you today!  We will refer you to see Dr Marquette for your joint pain.  We will check blood work.   Will give your shingles vaccine today.  Please continue to work on diet and exercise.  Return in about 1 year (around 06/16/2024) for Annual Physical.   Take care, Dr Kennyth  PLEASE NOTE:  If you had any lab tests, please let us  know if you have not heard back within a few days. You may see your results on mychart before we have a chance to review them but we will give you a call once they are reviewed by us .   If we ordered any referrals today, please let us  know if you have not heard from their office within the next week.   If you had any urgent prescriptions sent in today, please check with the pharmacy within an hour of our visit to make sure the prescription was transmitted appropriately.   Please try these tips to maintain a healthy lifestyle:  Eat at least 3 REAL meals and 1-2 snacks per day.  Aim for no more than 5 hours between eating.  If you eat breakfast, please do so within one hour of getting up.   Each meal should contain half fruits/vegetables, one quarter protein, and one quarter carbs (no bigger than a computer mouse)  Cut down on sweet beverages. This includes juice, soda, and sweet tea.   Drink at least 1 glass of water with each meal and aim for at least 8 glasses per day  Exercise at least 150 minutes every week.    Preventive Care 83-3 Years Old, Male Preventive care refers to lifestyle choices and visits with your health care provider that can promote health and wellness. Preventive care visits are also called wellness exams. What can I expect for my preventive care visit? Counseling During your preventive care visit, your health care provider may ask about your: Medical history, including: Past medical problems. Family medical history. Current health, including: Emotional well-being. Home life and relationship  well-being. Sexual activity. Lifestyle, including: Alcohol, nicotine or tobacco, and drug use. Access to firearms. Diet, exercise, and sleep habits. Safety issues such as seatbelt and bike helmet use. Sunscreen use. Work and work astronomer. Physical exam Your health care provider will check your: Height and weight. These may be used to calculate your BMI (body mass index). BMI is a measurement that tells if you are at a healthy weight. Waist circumference. This measures the distance around your waistline. This measurement also tells if you are at a healthy weight and may help predict your risk of certain diseases, such as type 2 diabetes and high blood pressure. Heart rate and blood pressure. Body temperature. Skin for abnormal spots. What immunizations do I need?  Vaccines are usually given at various ages, according to a schedule. Your health care provider will recommend vaccines for you based on your age, medical history, and lifestyle or other factors, such as travel or where you work. What tests do I need? Screening Your health care provider may recommend screening tests for certain conditions. This may include: Lipid and cholesterol levels. Diabetes screening. This is done by checking your blood sugar (glucose) after you have not eaten for a while (fasting). Hepatitis B test. Hepatitis C test. HIV (human immunodeficiency virus) test. STI (sexually transmitted infection) testing, if you are at risk. Lung cancer screening. Prostate cancer screening. Colorectal cancer screening. Talk with your health care provider about  your test results, treatment options, and if necessary, the need for more tests. Follow these instructions at home: Eating and drinking  Eat a diet that includes fresh fruits and vegetables, whole grains, lean protein, and low-fat dairy products. Take vitamin and mineral supplements as recommended by your health care provider. Do not drink alcohol if your  health care provider tells you not to drink. If you drink alcohol: Limit how much you have to 0-2 drinks a day. Know how much alcohol is in your drink. In the [place] one drink equals one 12 oz bottle of beer (355 mL), one 5 oz glass of wine (148 mL), or one 1 oz glass of hard liquor (44 mL). Lifestyle Brush your teeth every morning and night with fluoride toothpaste. Floss one time each day. Exercise for at least 30 minutes 5 or more days each week. Do not use any products that contain nicotine or tobacco. These products include cigarettes, chewing tobacco, and vaping devices, such as e-cigarettes. If you need help quitting, ask your health care provider. Do not use drugs. If you are sexually active, practice safe sex. Use a condom or other form of protection to prevent STIs. Take aspirin  only as told by your health care provider. Make sure that you understand how much to take and what form to take. Work with your health care provider to find out whether it is safe and beneficial for you to take aspirin  daily. Find healthy ways to manage stress, such as: Meditation, yoga, or listening to music. Journaling. Talking to a trusted person. Spending time with friends and family. Minimize exposure to UV radiation to reduce your risk of skin cancer. Safety Always wear your seat belt while driving or riding in a vehicle. Do not drive: If you have been drinking alcohol. Do not ride with someone who has been drinking. When you are tired or distracted. While texting. If you have been using any mind-altering substances or drugs. Wear a helmet and other protective equipment during sports activities. If you have firearms in your house, make sure you follow all gun safety procedures. What's next? Go to your health care provider once a year for an annual wellness visit. Ask your health care provider how often you should have your eyes and teeth checked. Stay up to date on all vaccines. This information  is not intended to replace advice given to you by your health care provider. Make sure you discuss any questions you have with your health care provider. Document Revised: 11/19/2020 Document Reviewed: 11/19/2020 Elsevier Patient Education  2024 [employer].

## 2023-06-17 NOTE — Assessment & Plan Note (Signed)
 Still having ongoing issues with this.  Will place referral to sports medicine.  He is interested in PRP injections.

## 2023-06-17 NOTE — Assessment & Plan Note (Signed)
 Blood pressure at goal on losartan 25 mg daily.  Will refill today.  Check labs.

## 2023-06-20 ENCOUNTER — Encounter: Payer: Self-pay | Admitting: Family Medicine

## 2023-06-20 DIAGNOSIS — E785 Hyperlipidemia, unspecified: Secondary | ICD-10-CM | POA: Insufficient documentation

## 2023-06-20 NOTE — Progress Notes (Signed)
 Cholesterol is up a little bit since last time that we checked.  The rest of his labs are all at goal.  Do not need to make any changes to treatment plan.  He should continue to work on diet and exercise and we can recheck everything in a year or so.

## 2023-07-12 ENCOUNTER — Other Ambulatory Visit: Payer: Self-pay | Admitting: Sports Medicine

## 2023-07-12 DIAGNOSIS — M25511 Pain in right shoulder: Secondary | ICD-10-CM | POA: Diagnosis not present

## 2023-07-12 DIAGNOSIS — M05411 Rheumatoid myopathy with rheumatoid arthritis of right shoulder: Secondary | ICD-10-CM | POA: Diagnosis not present

## 2023-07-12 DIAGNOSIS — G8929 Other chronic pain: Secondary | ICD-10-CM

## 2023-07-13 ENCOUNTER — Ambulatory Visit
Admission: RE | Admit: 2023-07-13 | Discharge: 2023-07-13 | Disposition: A | Discharge status: Home / Self Care | Payer: BC Managed Care – PPO | Source: Ambulatory Visit | Attending: Sports Medicine | Admitting: Sports Medicine

## 2023-07-13 DIAGNOSIS — M25511 Pain in right shoulder: Secondary | ICD-10-CM | POA: Diagnosis not present

## 2023-07-13 DIAGNOSIS — G8929 Other chronic pain: Secondary | ICD-10-CM

## 2023-07-13 DIAGNOSIS — M19011 Primary osteoarthritis, right shoulder: Secondary | ICD-10-CM | POA: Diagnosis not present

## 2023-08-10 DIAGNOSIS — M25511 Pain in right shoulder: Secondary | ICD-10-CM | POA: Diagnosis not present

## 2023-08-10 DIAGNOSIS — M9902 Segmental and somatic dysfunction of thoracic region: Secondary | ICD-10-CM | POA: Diagnosis not present

## 2023-08-10 DIAGNOSIS — M19011 Primary osteoarthritis, right shoulder: Secondary | ICD-10-CM | POA: Diagnosis not present

## 2023-08-10 DIAGNOSIS — M9908 Segmental and somatic dysfunction of rib cage: Secondary | ICD-10-CM | POA: Diagnosis not present

## 2023-08-10 DIAGNOSIS — M9907 Segmental and somatic dysfunction of upper extremity: Secondary | ICD-10-CM | POA: Diagnosis not present

## 2023-08-10 DIAGNOSIS — M9901 Segmental and somatic dysfunction of cervical region: Secondary | ICD-10-CM | POA: Diagnosis not present

## 2023-08-10 DIAGNOSIS — G4700 Insomnia, unspecified: Secondary | ICD-10-CM | POA: Diagnosis not present

## 2023-08-16 ENCOUNTER — Ambulatory Visit: Payer: BC Managed Care – PPO

## 2023-08-18 ENCOUNTER — Ambulatory Visit (INDEPENDENT_AMBULATORY_CARE_PROVIDER_SITE_OTHER): Payer: BC Managed Care – PPO

## 2023-08-18 DIAGNOSIS — Z23 Encounter for immunization: Secondary | ICD-10-CM

## 2023-08-18 NOTE — Progress Notes (Signed)
 Patient is in office today for a nurse visit for  Shingles #2 , per PCP's order. Patient Injection was given in the  Left deltoid. Patient tolerated injection well.

## 2023-10-10 ENCOUNTER — Ambulatory Visit
Admission: RE | Admit: 2023-10-10 | Discharge: 2023-10-10 | Disposition: A | Discharge status: Home / Self Care | Source: Ambulatory Visit | Attending: Sports Medicine | Admitting: Sports Medicine

## 2023-10-10 ENCOUNTER — Other Ambulatory Visit: Payer: Self-pay | Admitting: Sports Medicine

## 2023-10-10 DIAGNOSIS — M25511 Pain in right shoulder: Secondary | ICD-10-CM | POA: Diagnosis not present

## 2023-10-10 DIAGNOSIS — M19011 Primary osteoarthritis, right shoulder: Secondary | ICD-10-CM | POA: Diagnosis not present

## 2023-10-26 DIAGNOSIS — M19011 Primary osteoarthritis, right shoulder: Secondary | ICD-10-CM | POA: Diagnosis not present

## 2023-10-26 DIAGNOSIS — M25511 Pain in right shoulder: Secondary | ICD-10-CM | POA: Diagnosis not present

## 2023-12-22 DIAGNOSIS — I10 Essential (primary) hypertension: Secondary | ICD-10-CM | POA: Diagnosis not present

## 2023-12-22 DIAGNOSIS — N529 Male erectile dysfunction, unspecified: Secondary | ICD-10-CM | POA: Diagnosis not present

## 2023-12-22 DIAGNOSIS — R140 Abdominal distension (gaseous): Secondary | ICD-10-CM | POA: Diagnosis not present

## 2023-12-22 DIAGNOSIS — M2550 Pain in unspecified joint: Secondary | ICD-10-CM | POA: Diagnosis not present

## 2023-12-22 DIAGNOSIS — R5383 Other fatigue: Secondary | ICD-10-CM | POA: Diagnosis not present

## 2024-02-02 DIAGNOSIS — R5383 Other fatigue: Secondary | ICD-10-CM | POA: Diagnosis not present

## 2024-02-02 DIAGNOSIS — M2550 Pain in unspecified joint: Secondary | ICD-10-CM | POA: Diagnosis not present

## 2024-02-02 DIAGNOSIS — N529 Male erectile dysfunction, unspecified: Secondary | ICD-10-CM | POA: Diagnosis not present

## 2024-02-02 DIAGNOSIS — R140 Abdominal distension (gaseous): Secondary | ICD-10-CM | POA: Diagnosis not present

## 2024-02-27 DIAGNOSIS — E291 Testicular hypofunction: Secondary | ICD-10-CM | POA: Diagnosis not present

## 2024-02-27 DIAGNOSIS — R3912 Poor urinary stream: Secondary | ICD-10-CM | POA: Diagnosis not present

## 2024-03-01 ENCOUNTER — Encounter: Payer: Self-pay | Admitting: Family Medicine

## 2024-03-01 ENCOUNTER — Ambulatory Visit (INDEPENDENT_AMBULATORY_CARE_PROVIDER_SITE_OTHER): Admitting: Family Medicine

## 2024-03-01 VITALS — BP 133/66 | HR 83 | Temp 98.1°F | Ht 74.0 in | Wt 184.0 lb

## 2024-03-01 DIAGNOSIS — J029 Acute pharyngitis, unspecified: Secondary | ICD-10-CM | POA: Diagnosis not present

## 2024-03-01 LAB — POCT RAPID STREP A (OFFICE): Rapid Strep A Screen: POSITIVE — AB

## 2024-03-01 LAB — POC COVID19 BINAXNOW: SARS Coronavirus 2 Ag: NEGATIVE

## 2024-03-01 MED ORDER — AZITHROMYCIN 250 MG PO TABS: ORAL_TABLET | ORAL | 0 refills | Status: DC

## 2024-03-01 MED ORDER — PREDNISONE 50 MG PO TABS: ORAL_TABLET | ORAL | 0 refills | Status: DC

## 2024-03-01 NOTE — Patient Instructions (Signed)
 It was very nice to see you today!  VISIT SUMMARY: You came in with a cough and sore throat, and tested positive for strep throat. Given your history of frequent strep infections and past issues with amoxicillin , we have prescribed azithromycin  (Z-Pak) and prednisone  to help you recover.  YOUR PLAN: ACUTE STREPTOCOCCAL PHARYNGITIS: You have a confirmed strep throat infection. -Take azithromycin  (Z-Pak) as prescribed to avoid gastrointestinal side effects. -Take prednisone  as prescribed to help speed up your recovery. -Report back if your symptoms do not improve.  Return if symptoms worsen or fail to improve.   Take care, Dr Kennyth  PLEASE NOTE:  If you had any lab tests, please let us  know if you have not heard back within a few days. You may see your results on mychart before we have a chance to review them but we will give you a call once they are reviewed by us .   If we ordered any referrals today, please let us  know if you have not heard from their office within the next week.   If you had any urgent prescriptions sent in today, please check with the pharmacy within an hour of our visit to make sure the prescription was transmitted appropriately.   Please try these tips to maintain a healthy lifestyle:  Eat at least 3 REAL meals and 1-2 snacks per day.  Aim for no more than 5 hours between eating.  If you eat breakfast, please do so within one hour of getting up.   Each meal should contain half fruits/vegetables, one quarter protein, and one quarter carbs (no bigger than a computer mouse)  Cut down on sweet beverages. This includes juice, soda, and sweet tea.   Drink at least 1 glass of water with each meal and aim for at least 8 glasses per day  Exercise at least 150 minutes every week.

## 2024-03-01 NOTE — Progress Notes (Signed)
   Fraser Busche is a 53 y.o. male who presents today for an office visit.  Assessment/Plan:  Sore Throat Rapid strep positive. COVID-negative.  He had issues with amoxicillin  in the past.  Will start azithromycin .  He tolerated this well previously.  He has an event coming up for his wife's birthday this weekend and would also like to try a prednisone  burst.  He has previously tolerated this well.  Will send in 50 mg daily.  Encouraged hydration.  He can use over-the-counter meds as needed.  He will let us  know if not improving.     Subjective:  HPI:  See assessment / plan for status of chronic conditions.    Discussed the use of AI scribe software for clinical note transcription with the patient, who gave verbal consent to proceed.  History of Present Illness Moriah Loughry is a 53 year old male with a history of frequent strep infections who presents with a cough and sore throat.  He began experiencing symptoms a couple of days ago, starting with a cough and sore throat. His wife had similar symptoms and was diagnosed with a sinus infection after a negative strep test. She was treated with penicillin, which resolved her symptoms quickly. However, he tested positive for strep. He finds it interesting as this is the second time his wife tested negative while he tested positive.  He has a history of frequent strep infections, stating that 'eighty percent of the time when I'm sick is strep.' He recalls a previous instance where he had strep along with COVID-19. He usually waits until white patches appear in his throat before seeking treatment.  In the past, he was treated with amoxicillin  for strep, which caused significant gastrointestinal issues, specifically diarrhea. He recalls that azithromycin  (Z-Pak) was used in the past and was less harsh on his digestive system. He also mentions a positive response to prednisone , which he has used for other conditions such as back pain.  He reports  feeling 'tight' in his ears as well. No white patches in his throat yet, as his symptoms started only a couple of days ago.         Objective:  Physical Exam: BP 133/66   Pulse 83   Temp 98.1 F (36.7 C) (Temporal)   Ht 6' 2 (1.88 m)   Wt 184 lb (83.5 kg)   SpO2 98%   BMI 23.62 kg/m   Gen: No acute distress, resting comfortably HEENT: TMs clear effusion.  OP erythematous.  No exudates. CV: Regular rate and rhythm with no murmurs appreciated Pulm: Normal work of breathing, clear to auscultation bilaterally with no crackles, wheezes, or rhonchi Neuro: Grossly normal, moves all extremities Psych: Normal affect and thought content      Nollan Muldrow M. Kennyth, MD 03/01/2024 11:19 AM

## 2024-03-08 ENCOUNTER — Telehealth: Payer: Self-pay | Admitting: *Deleted

## 2024-03-08 ENCOUNTER — Ambulatory Visit: Payer: Self-pay

## 2024-03-08 NOTE — Telephone Encounter (Signed)
 FYI Only or Action Required?: Action required by provider: symptoms of strep throat are reoccurring and would like another round of antibiotics and steroids if needed. .  Patient was last seen in primary care on 03/01/2024 by Kennyth Worth HERO, MD.  Called Nurse Triage reporting Sore Throat.  Symptoms began a week ago.  Interventions attempted: Nothing.  Symptoms are: unchanged.  Triage Disposition: Call PCP Within 24 Hours  Patient/caregiver understands and will follow disposition?: No, wishes to speak with PCP     Reason for Disposition  [1] Positive throat culture or rapid strep test (according to lab, PCP, caller, etc.) AND [2] NO standing order to call in prescription for antibiotic  Answer Assessment - Initial Assessment Questions 1. ONSET: When did the throat start hurting? (Hours or days ago)      Was seen last Thursday and dx with strep throat and received a zpack and prednison are finished.  2. SEVERITY: How bad is the sore throat? (Scale 1-10; mild, moderate or severe)     moderate 3. STREP EXPOSURE: Has there been any exposure to strep within the past week? If Yes, ask: What type of contact occurred?      Dx last week 4.  VIRAL SYMPTOMS: Are there any symptoms of a cold, such as a runny nose, cough, hoarse voice or red eyes?      Ear pressure, ear ringing, white patches in throat 5. FEVER: Do you have a fever? If Yes, ask: What is your temperature, how was it measured, and when did it start?     denies 6. PUS ON THE TONSILS: Is there pus on the tonsils in the back of your throat?     yes 7. OTHER SYMPTOMS: Do you have any other symptoms? (e.g., difficulty breathing, headache, rash)     headache 8. PREGNANCY: Is there any chance you are pregnant? When was your last menstrual period?     na  Protocols used: Sore Throat-A-AH

## 2024-03-08 NOTE — Telephone Encounter (Signed)
 Spoke with patient, notified PCP not in clinic at this time  Message was send to PCP

## 2024-03-08 NOTE — Telephone Encounter (Signed)
 Copied from CRM #[id number]. Topic: General - Other >> Mar 08, 2024  8:13 AM Revonda D wrote: Reason for CRM: Pt stated that he was recently seen last week for strep throat and was prescribed some medication. Pt stated that he finished taking the medication but it still feels like he has strep throat. Pt would like to know if Dr.Parker could prescribed some more or a stronger medication and would like a callback with an update.   Please advise  Nalany Steedley,RMA

## 2024-03-09 MED ORDER — AZITHROMYCIN 250 MG PO TABS: ORAL_TABLET | ORAL | 0 refills | Status: DC

## 2024-03-09 NOTE — Telephone Encounter (Signed)
 Spoke to pt told him I refilled his Azithromycin  per Dr. Kennyth and need an appt if not improving. Pt verbalized understanding.

## 2024-03-09 NOTE — Telephone Encounter (Signed)
 Ok to refill the azithromycin .  Needs follow-up here if not improving.

## 2024-03-14 ENCOUNTER — Ambulatory Visit (INDEPENDENT_AMBULATORY_CARE_PROVIDER_SITE_OTHER): Admitting: Family Medicine

## 2024-03-14 ENCOUNTER — Encounter: Payer: Self-pay | Admitting: Family Medicine

## 2024-03-14 VITALS — BP 138/80 | HR 65 | Temp 98.4°F | Ht 74.0 in | Wt 184.2 lb

## 2024-03-14 DIAGNOSIS — H9313 Tinnitus, bilateral: Secondary | ICD-10-CM | POA: Diagnosis not present

## 2024-03-14 DIAGNOSIS — J020 Streptococcal pharyngitis: Secondary | ICD-10-CM

## 2024-03-14 DIAGNOSIS — R519 Headache, unspecified: Secondary | ICD-10-CM

## 2024-03-14 NOTE — Progress Notes (Signed)
 Subjective  CC:  Chief Complaint  Patient presents with   Tinnitus    Low grade headache x1 week and some ringing in the right ear that comes and goes    HPI: Johnathan Walker is a 53 y.o. male who presents to the office today to address the problems listed above in the chief complaint. Discussed the use of AI scribe software for clinical note transcription with the patient, who gave verbal consent to proceed.  Same day acute visit; PCP not available. New pt to me. Chart reviewed.   History of Present Illness Johnathan Walker is a 54 year old male who presents with persistent headaches and tinnitus following a recent strep throat infection.  Headache - Persistent mild daily headaches for the past couple of weeks; started with strep throat.  I reviewed recent notes. - Headaches are not debilitating, no red flag symptoms: No severe headache neurologic changes fevers and stiff neck awakening with headache - Symptoms managed with Excedrin, but concerned about frequent use - No prior history of frequent headaches  Tinnitus - Intermittent tinnitus since recent strep throat infection - No subjective new hearing loss, though wife suggests possible change - No current nasal congestion or allergy symptoms  Pharyngitis - Recent strep throat infection a couple of weeks ago - Initially treated with azithromycin  (Z-Pak) - Symptoms recurred, including sore throat and visible white patches, requiring a second course of antibiotics - Wife experienced similar symptoms but has since recovered - Reports he is intolerant of amoxicillin  although it can give him diarrhea.  He would like his allergy removed.  Would prefer penicillin for strep throat infections in the future.   Assessment  1. Tinnitus of both ears   2. Daily headache   3. Recurrent streptococcal pharyngitis      Plan  Assessment and Plan Assessment & Plan Recurrent daily headaches Recurrent daily headaches for the past couple of  weeks, mild in severity, not debilitating. Possible rebound headaches due to frequent use of Excedrin. No red flag symptoms such as severe headache or stiff neck. No current signs of strep or ear infection. - Discontinue Excedrin to prevent rebound headaches. - Consider using an allergy medicine or decongestant to address any residual congestion or inflammation.  Tinnitus Intermittent tinnitus, possibly related to recent infections causing fluid or inflammation in the eustachian tubes. No new hearing loss reported. Tinnitus can occur without a clear cause and is difficult to treat. - Consider using an allergy pill to help clear up any residual eustachian tube congestion.  Removed amoxicillin  allergy.  Follow up: As needed No orders of the defined types were placed in this encounter.  No orders of the defined types were placed in this encounter.    I reviewed the patients updated PMH, FH, and SocHx.  Patient Active Problem List   Diagnosis Date Noted   Dyslipidemia 06/20/2023   Arthropathy 05/20/2022   Chronic low back pain 11/18/2020   Psoriasis 11/18/2020   AF (atrial fibrillation) (HCC) s/p ablation 03/17/2017   Essential hypertension 11/16/2016   Current Meds  Medication Sig   Glucosamine 500 MG CAPS Take by mouth.   losartan  (COZAAR ) 50 MG tablet Take 0.5 tablets (25 mg total) by mouth daily.   niacin  (SLO-NIACIN ) 500 MG tablet Take 1,500 mg by mouth at bedtime.   Omega-3 Fatty Acids (FISH OIL OMEGA-3 PO) Take 2 capsules by mouth.   Allergies: Patient has no active allergies. Family History: Patient family history includes Atrial fibrillation in his mother;  Diabetes in his father; Hypertension in his father; Lung cancer in his father. Social History:  Patient  reports that he has never smoked. He has never been exposed to tobacco smoke. He has never used smokeless tobacco. He reports that he does not drink alcohol and does not use drugs.  Review of  Systems: Constitutional: Negative for fever malaise or anorexia Cardiovascular: negative for chest pain Respiratory: negative for SOB or persistent cough Gastrointestinal: negative for abdominal pain  Objective  Vitals: BP 138/80   Pulse 65   Temp 98.4 F (36.9 C)   Ht 6' 2 (1.88 m)   Wt 184 lb 3.2 oz (83.6 kg)   SpO2 97%   BMI 23.65 kg/m  General: no acute distress , A&Ox3 HEENT: PEERL, conjunctiva normal, neck is supple normal external auditory canals bilaterally, normal TM bilaterally, oropharynx clear, no cervical lymphadenopathy Skin:  Warm, no rashes Commons side effects, risks, benefits, and alternatives for medications and treatment plan prescribed today were discussed, and the patient expressed understanding of the given instructions. Patient is instructed to call or message via MyChart if he/she has any questions or concerns regarding our treatment plan. No barriers to understanding were identified. We discussed Red Flag symptoms and signs in detail. Patient expressed understanding regarding what to do in case of urgent or emergency type symptoms.  Medication list was reconciled, printed and provided to the patient in AVS. Patient instructions and summary information was reviewed with the patient as documented in the AVS. This note was prepared with assistance of Dragon voice recognition software. Occasional wrong-word or sound-a-like substitutions may have occurred due to the inherent limitations of voice recognition software

## 2024-03-14 NOTE — Patient Instructions (Signed)
 Please follow up if symptoms do not improve or as needed.     VISIT SUMMARY: Today, we discussed your persistent headaches and tinnitus following a recent strep throat infection. We reviewed your symptoms and treatment history, and I provided recommendations to help manage your current issues.  YOUR PLAN: -RECURRENT DAILY HEADACHES: You have been experiencing mild daily headaches for the past couple of weeks, which may be due to frequent use of Excedrin. Rebound headaches can occur when pain relief medication is used too often. To prevent this, you should stop using Excedrin. Additionally, consider using an allergy medicine or decongestant to address any residual congestion or inflammation that might be contributing to your headaches.  -TINNITUS: You have intermittent tinnitus, which is a ringing or buzzing noise in one ear. This may be related to recent infections causing fluid or inflammation in the eustachian tubes. Tinnitus can sometimes occur without a clear cause and can be difficult to treat. To help clear up any residual eustachian tube congestion, consider using an allergy pill.  INSTRUCTIONS: Please discontinue the use of Excedrin to prevent rebound headaches. Consider using an allergy medicine or decongestant for any residual congestion or inflammation. If your symptoms persist or worsen, please schedule a follow-up appointment.                      Contains text generated by [employer].                                 Contains text generated by [employer].

## 2024-06-11 ENCOUNTER — Telehealth: Payer: Self-pay | Admitting: *Deleted

## 2024-06-11 NOTE — Telephone Encounter (Signed)
 Copied from CRM [id number]. Topic: Clinical - Request for Lab/Test Order >> Jun 08, 2024  3:28 PM Suzen RAMAN wrote: Reason for CRM: patient would like to have labs completed prior to upcoming physical 06/22/23. Prefer date and time: Friday 06/16/23 first of the morning. Please contact patient to schedule.  CB#[phone]  Please advise  Raechal Raben,RMA

## 2024-06-12 DIAGNOSIS — Z13220 Encounter for screening for lipoid disorders: Secondary | ICD-10-CM

## 2024-06-12 DIAGNOSIS — Z131 Encounter for screening for diabetes mellitus: Secondary | ICD-10-CM

## 2024-06-12 DIAGNOSIS — I10 Essential (primary) hypertension: Secondary | ICD-10-CM

## 2024-06-12 DIAGNOSIS — Z0001 Encounter for general adult medical examination with abnormal findings: Secondary | ICD-10-CM

## 2024-06-12 DIAGNOSIS — E785 Hyperlipidemia, unspecified: Secondary | ICD-10-CM

## 2024-06-12 DIAGNOSIS — R739 Hyperglycemia, unspecified: Secondary | ICD-10-CM

## 2024-06-12 NOTE — Telephone Encounter (Signed)
 Please schedule a lab appt prior to CPE appt on 06/21/2024

## 2024-06-12 NOTE — Telephone Encounter (Signed)
 SABRA

## 2024-06-12 NOTE — Telephone Encounter (Signed)
 Patient is scheduled for Lab appt 06/15/24

## 2024-06-12 NOTE — Telephone Encounter (Signed)
 Ok with me. Please place any necessary orders.

## 2024-06-15 ENCOUNTER — Other Ambulatory Visit (INDEPENDENT_AMBULATORY_CARE_PROVIDER_SITE_OTHER)

## 2024-06-15 ENCOUNTER — Ambulatory Visit: Payer: Self-pay | Admitting: Family Medicine

## 2024-06-15 DIAGNOSIS — E785 Hyperlipidemia, unspecified: Secondary | ICD-10-CM | POA: Diagnosis not present

## 2024-06-15 DIAGNOSIS — Z131 Encounter for screening for diabetes mellitus: Secondary | ICD-10-CM

## 2024-06-15 DIAGNOSIS — Z0001 Encounter for general adult medical examination with abnormal findings: Secondary | ICD-10-CM

## 2024-06-15 DIAGNOSIS — R739 Hyperglycemia, unspecified: Secondary | ICD-10-CM | POA: Diagnosis not present

## 2024-06-15 DIAGNOSIS — I10 Essential (primary) hypertension: Secondary | ICD-10-CM | POA: Diagnosis not present

## 2024-06-15 DIAGNOSIS — Z13220 Encounter for screening for lipoid disorders: Secondary | ICD-10-CM | POA: Diagnosis not present

## 2024-06-15 LAB — CBC
HCT: 40.9 % (ref 39.0–52.0)
Hemoglobin: 14.2 g/dL (ref 13.0–17.0)
MCHC: 34.8 g/dL (ref 30.0–36.0)
MCV: 93.6 fl (ref 78.0–100.0)
Platelets: 182 K/uL (ref 150.0–400.0)
RBC: 4.36 Mil/uL (ref 4.22–5.81)
RDW: 12.7 % (ref 11.5–15.5)
WBC: 3.6 K/uL — ABNORMAL LOW (ref 4.0–10.5)

## 2024-06-15 LAB — COMPREHENSIVE METABOLIC PANEL WITH GFR
ALT: 14 U/L (ref 3–53)
AST: 16 U/L (ref 5–37)
Albumin: 4.2 g/dL (ref 3.5–5.2)
Alkaline Phosphatase: 53 U/L (ref 39–117)
BUN: 22 mg/dL (ref 6–23)
CO2: 28 meq/L (ref 19–32)
Calcium: 8.8 mg/dL (ref 8.4–10.5)
Chloride: 106 meq/L (ref 96–112)
Creatinine, Ser: 0.95 mg/dL (ref 0.40–1.50)
GFR: 91.79 mL/min
Glucose, Bld: 99 mg/dL (ref 70–99)
Potassium: 3.8 meq/L (ref 3.5–5.1)
Sodium: 141 meq/L (ref 135–145)
Total Bilirubin: 1.1 mg/dL (ref 0.2–1.2)
Total Protein: 6.5 g/dL (ref 6.0–8.3)

## 2024-06-15 LAB — HEMOGLOBIN A1C: Hgb A1c MFr Bld: 5 % (ref 4.6–6.5)

## 2024-06-15 LAB — TSH: TSH: 2.59 u[IU]/mL (ref 0.35–5.50)

## 2024-06-15 NOTE — Progress Notes (Signed)
 Blood work is all stable.  We can discuss more at his upcoming office visit.

## 2024-06-21 ENCOUNTER — Encounter: Payer: Self-pay | Admitting: Family Medicine

## 2024-06-21 ENCOUNTER — Ambulatory Visit: Payer: BC Managed Care – PPO | Admitting: Family Medicine

## 2024-06-21 VITALS — BP 122/68 | HR 81 | Temp 98.1°F | Ht 74.0 in | Wt 183.8 lb

## 2024-06-21 DIAGNOSIS — Z125 Encounter for screening for malignant neoplasm of prostate: Secondary | ICD-10-CM

## 2024-06-21 DIAGNOSIS — E291 Testicular hypofunction: Secondary | ICD-10-CM | POA: Diagnosis not present

## 2024-06-21 DIAGNOSIS — M129 Arthropathy, unspecified: Secondary | ICD-10-CM | POA: Diagnosis not present

## 2024-06-21 DIAGNOSIS — Z0001 Encounter for general adult medical examination with abnormal findings: Secondary | ICD-10-CM

## 2024-06-21 DIAGNOSIS — I10 Essential (primary) hypertension: Secondary | ICD-10-CM | POA: Diagnosis not present

## 2024-06-21 DIAGNOSIS — Z1211 Encounter for screening for malignant neoplasm of colon: Secondary | ICD-10-CM

## 2024-06-21 DIAGNOSIS — E785 Hyperlipidemia, unspecified: Secondary | ICD-10-CM | POA: Diagnosis not present

## 2024-06-21 LAB — LIPID PANEL
Cholesterol: 165 mg/dL (ref 28–200)
HDL: 62.5 mg/dL
LDL Cholesterol: 90 mg/dL (ref 10–99)
NonHDL: 102.51
Total CHOL/HDL Ratio: 3
Triglycerides: 64 mg/dL (ref 10.0–149.0)
VLDL: 12.8 mg/dL (ref 0.0–40.0)

## 2024-06-21 LAB — PSA: PSA: 1.04 ng/mL (ref 0.10–4.00)

## 2024-06-21 LAB — TESTOSTERONE: Testosterone: 270.59 ng/dL — ABNORMAL LOW (ref 300.00–890.00)

## 2024-06-21 MED ORDER — LOSARTAN POTASSIUM 50 MG PO TABS: 25.0000 mg | ORAL_TABLET | Freq: Every day | ORAL | 3 refills | Status: AC

## 2024-06-21 NOTE — Progress Notes (Signed)
 "  Chief Complaint:  Johnathan Walker is a 54 y.o. male who presents today for his annual comprehensive physical exam.    Assessment/Plan:  Chronic Problems Addressed Today: Dyslipidemia Recent labs did not include cholesterol panel.  Will reorder today.  Discussed lifestyle modifications.  Essential hypertension Blood pressure at goal today on losartan  25 mg daily.  Will refill today.  Hypogonadism male Patient with history of low testosterone .  Will recheck today.  He has symptoms including low energy and fatigue.  Currently taking DHEA supplement and working on strength training exercises as well.  He may be interested in testosterone  replacement though is not sure at this point.  He will let us  know if he would like to go forward with this.  Arthropathy Discussed conservative measures including strength training, compression, ice, and over-the-counter meds as needed.  Preventative Healthcare: Vaccines declined.  Will refer for colonoscopy.  Patient Counseling(The following topics were reviewed and/or handout was given):  -Nutrition: Stressed importance of moderation in sodium/caffeine intake, saturated fat and cholesterol, caloric balance, sufficient intake of fresh fruits, vegetables, and fiber.  -Stressed the importance of regular exercise.   -Substance Abuse: Discussed cessation/primary prevention of tobacco, alcohol, or other drug use; driving or other dangerous activities under the influence; availability of treatment for abuse.   -Injury prevention: Discussed safety belts, safety helmets, smoke detector, smoking near bedding or upholstery.   -Sexuality: Discussed sexually transmitted diseases, partner selection, use of condoms, avoidance of unintended pregnancy and contraceptive alternatives.   -Dental health: Discussed importance of regular tooth brushing, flossing, and dental visits.  -Health maintenance and immunizations reviewed. Please refer to Health maintenance  section.  Return to care in 1 year for next preventative visit.     Subjective:  HPI:  He has ano acute complaints today. Patient is here today for his annual physical.  See assessment / plan for status of chronic conditions.  Discussed the use of AI scribe software for clinical note transcription with the patient, who gave verbal consent to proceed.  History of Present Illness Johnathan Walker is a 54 year old male who presents for an annual physical exam.  He is particularly interested in discussing his recent blood work, specifically cholesterol, PSA, and testosterone  levels, which were not released by the lab. He has been considering testosterone  treatment due to experiencing days of feeling drained and low energy, which have become more frequent.  He maintains a physically active lifestyle, running three days a week and going to the gym three days a week. He recently reduced his running from 26 miles a week to 12-13 miles a week to focus more on weight training, which he believes has helped with his energy levels. He also has a physically demanding job.  His diet has improved over the past six months, with a reduction in sweets and an increase in fruits and vegetables. He is currently taking DHEA 25 mg and creatine, having recently increased his creatine dose from 5 mg to 10 mg, which he feels has been beneficial.  He has a history of arthritis, particularly affecting his shoulders, and has been using ice packs for relief. He has not found turmeric helpful and is considering glucosamine chondroitin supplements.  He recently had his hearing checked and does not require a hearing aid, though he notes difficulty hearing higher-pitched voices in noisy environments.       06/21/2024    8:05 AM  Depression screen PHQ 2/9  Decreased Interest 0  Down, Depressed, Hopeless 0  PHQ - 2 Score 0    There are no preventive care reminders to display for this patient.   ROS: Per HPI, otherwise  a complete review of systems was negative.   PMH:  The following were reviewed and entered/updated in epic: Past Medical History:  Diagnosis Date   Atrial fibrillation with rapid ventricular response (HCC) 11/16/2016   Essential hypertension 11/16/2016   Hypercholesteremia    Hypertension    New onset atrial fibrillation (HCC) 11/15/2016   Patient Active Problem List   Diagnosis Date Noted   Hypogonadism male 06/21/2024   Dyslipidemia 06/20/2023   Arthropathy 05/20/2022   Chronic low back pain 11/18/2020   Psoriasis 11/18/2020   AF (atrial fibrillation) (HCC) s/p ablation 03/17/2017   Essential hypertension 11/16/2016   Past Surgical History:  Procedure Laterality Date   ATRIAL FIBRILLATION ABLATION  03/17/2017   ATRIAL FIBRILLATION ABLATION N/A 03/17/2017   Procedure: Atrial Fibrillation Ablation;  Surgeon: Inocencio Soyla Lunger, MD;  [facility];  Service: Cardiovascular;  Laterality: N/A;    Family History  Problem Relation Age of Onset   Atrial fibrillation Mother    Diabetes Father    Hypertension Father    Lung cancer Father     Medications- reviewed and updated Current Outpatient Medications  Medication Sig Dispense Refill   Glucosamine 500 MG CAPS Take by mouth.     niacin  (SLO-NIACIN ) 500 MG tablet Take 1,500 mg by mouth at bedtime.     Omega-3 Fatty Acids (FISH OIL OMEGA-3 PO) Take 2 capsules by mouth.     tadalafil (CIALIS) 5 MG tablet TAKE 1 TABLET BY MOUTH ONCE DAILY FOR PROSTATE ENLARGEMENT, MAY TAKE AN ADDITIONAL 1-3 TABLETS 30 MINUTES TO 4 HOURS PRIOR TO SEXUAL ACTIVITY AS NEEDED FOR ERECTILE DYSFUNCTION.DO NOT EXCEED 4 TABLETS DAILY     tamsulosin (FLOMAX) 0.4 MG CAPS capsule Take 0.4 mg by mouth at bedtime.     losartan  (COZAAR ) 50 MG tablet Take 0.5 tablets (25 mg total) by mouth daily. 45 tablet 3   No current facility-administered medications for this visit.    Allergies-reviewed and updated Allergies[1]  Social History    Socioeconomic History   Marital status: Married    Spouse name: Not on file   Number of children: Not on file   Years of education: Not on file   Highest education level: Not on file  Occupational History   Not on file  Tobacco Use   Smoking status: Never    Passive exposure: Never   Smokeless tobacco: Never  Vaping Use   Vaping status: Never Used  Substance and Sexual Activity   Alcohol use: No   Drug use: No   Sexual activity: Not on file  Other Topics Concern   Not on file  Social History Narrative   Not on file   Social Drivers of Health   Tobacco Use: Low Risk (06/21/2024)   Patient History    Smoking Tobacco Use: Never    Smokeless Tobacco Use: Never    Passive Exposure: Never  Financial Resource Strain: Not on file  Food Insecurity: Not on file  Transportation Needs: Not on file  Physical Activity: Not on file  Stress: Not on file  Social Connections: Not on file  Depression (PHQ2-9): Low Risk (06/21/2024)   Depression (PHQ2-9)    PHQ-2 Score: 0  Alcohol Screen: Not on file  Housing: Not on file  Utilities: Not on file  Health Literacy: Not on file  Objective:  Physical Exam: BP 122/68   Pulse 81   Temp 98.1 F (36.7 C) (Temporal)   Ht 6' 2 (1.88 m)   Wt 183 lb 12.8 oz (83.4 kg)   SpO2 96%   BMI 23.60 kg/m   Body mass index is 23.6 kg/m. Wt Readings from Last 3 Encounters:  06/21/24 183 lb 12.8 oz (83.4 kg)  03/14/24 184 lb 3.2 oz (83.6 kg)  03/01/24 184 lb (83.5 kg)   Gen: NAD, resting comfortably HEENT: TMs normal bilaterally. OP clear. No thyromegaly noted.  CV: RRR with no murmurs appreciated Pulm: NWOB, CTAB with no crackles, wheezes, or rhonchi GI: Normal bowel sounds present. Soft, Nontender, Nondistended. MSK: no edema, cyanosis, or clubbing noted Skin: warm, dry Neuro: CN2-12 grossly intact. Strength 5/5 in upper and lower extremities. Reflexes symmetric and intact bilaterally.  Psych: Normal affect and thought content      Moriyah Byington M. Kennyth, MD 06/21/2024 8:49 AM     [1] No Active Allergies  "

## 2024-06-21 NOTE — Assessment & Plan Note (Signed)
 Recent labs did not include cholesterol panel.  Will reorder today.  Discussed lifestyle modifications.

## 2024-06-21 NOTE — Assessment & Plan Note (Signed)
 Patient with history of low testosterone .  Will recheck today.  He has symptoms including low energy and fatigue.  Currently taking DHEA supplement and working on strength training exercises as well.  He may be interested in testosterone  replacement though is not sure at this point.  He will let us  know if he would like to go forward with this.

## 2024-06-21 NOTE — Assessment & Plan Note (Signed)
 Blood pressure at goal today on losartan  25 mg daily.  Will refill today.

## 2024-06-21 NOTE — Patient Instructions (Signed)
 It was very nice to see you today!  VISIT SUMMARY: Today, you had your annual physical exam. We discussed your recent blood work, energy levels, joint pain, and general health maintenance.  YOUR PLAN: DYSLIPIDEMIA: Your cholesterol levels were not available due to lab order issues. -You do not need to fast for the cholesterol test. We have ordered a new cholesterol test for you.  MALE HYPOGONADISM: You have been experiencing low energy and fatigue. -We discussed testosterone  replacement therapy, including its benefits, risks, and FDA regulations. -We have ordered a testosterone  level test. If you are interested, we can discuss testosterone  replacement therapy based on the results.  ARTHROPATHY: You have joint pain, especially in your shoulders. -We discussed strength training, glucosamine chondroitin supplements, and compression sleeves for joint support. -Consider focusing your strength training on muscles that cross your joints. -You may try glucosamine chondroitin supplements and use compression sleeves for additional support.  GENERAL HEALTH MAINTENANCE: You are maintaining regular physical activity and have made dietary improvements. -We have ordered a colonoscopy for colon cancer screening as you prefer this method. -Continue with your regular physical activity and dietary improvements. -We discussed flu and pneumonia vaccinations, which you have deferred for now.  Return in about 1 year (around 06/21/2025) for Annual Physical.   Take care, Dr Kennyth  PLEASE NOTE:  If you had any lab tests, please let us  know if you have not heard back within a few days. You may see your results on mychart before we have a chance to review them but we will give you a call once they are reviewed by us .   If we ordered any referrals today, please let us  know if you have not heard from their office within the next week.   If you had any urgent prescriptions sent in today, please check with the  pharmacy within an hour of our visit to make sure the prescription was transmitted appropriately.   Please try these tips to maintain a healthy lifestyle:  Eat at least 3 REAL meals and 1-2 snacks per day.  Aim for no more than 5 hours between eating.  If you eat breakfast, please do so within one hour of getting up.   Each meal should contain half fruits/vegetables, one quarter protein, and one quarter carbs (no bigger than a computer mouse)  Cut down on sweet beverages. This includes juice, soda, and sweet tea.   Drink at least 1 glass of water with each meal and aim for at least 8 glasses per day  Exercise at least 150 minutes every week.     Preventive Care 26-33 Years Old, Male Preventive care refers to lifestyle choices and visits with your health care provider that can promote health and wellness. Preventive care visits are also called wellness exams. What can I expect for my preventive care visit? Counseling During your preventive care visit, your health care provider may ask about your: Medical history, including: Past medical problems. Family medical history. Current health, including: Emotional well-being. Home life and relationship well-being. Sexual activity. Lifestyle, including: Alcohol, nicotine or tobacco, and drug use. Access to firearms. Diet, exercise, and sleep habits. Safety issues such as seatbelt and bike helmet use. Sunscreen use. Work and work astronomer. Physical exam Your health care provider will check your: Height and weight. These may be used to calculate your BMI (body mass index). BMI is a measurement that tells if you are at a healthy weight. Waist circumference. This measures the distance around your waistline. This  measurement also tells if you are at a healthy weight and may help predict your risk of certain diseases, such as type 2 diabetes and high blood pressure. Heart rate and blood pressure. Body temperature. Skin for abnormal  spots. What immunizations do I need?  Vaccines are usually given at various ages, according to a schedule. Your health care provider will recommend vaccines for you based on your age, medical history, and lifestyle or other factors, such as travel or where you work. What tests do I need? Screening Your health care provider may recommend screening tests for certain conditions. This may include: Lipid and cholesterol levels. Diabetes screening. This is done by checking your blood sugar (glucose) after you have not eaten for a while (fasting). Hepatitis B test. Hepatitis C test. HIV (human immunodeficiency virus) test. STI (sexually transmitted infection) testing, if you are at risk. Lung cancer screening. Prostate cancer screening. Colorectal cancer screening. Talk with your health care provider about your test results, treatment options, and if necessary, the need for more tests. Follow these instructions at home: Eating and drinking  Eat a diet that includes fresh fruits and vegetables, whole grains, lean protein, and low-fat dairy products. Take vitamin and mineral supplements as recommended by your health care provider. Do not drink alcohol if your health care provider tells you not to drink. If you drink alcohol: Limit how much you have to 0-2 drinks a day. Know how much alcohol is in your drink. In the [place] one drink equals one 12 oz bottle of beer (355 mL), one 5 oz glass of wine (148 mL), or one 1 oz glass of hard liquor (44 mL). Lifestyle Brush your teeth every morning and night with fluoride toothpaste. Floss one time each day. Exercise for at least 30 minutes 5 or more days each week. Do not use any products that contain nicotine or tobacco. These products include cigarettes, chewing tobacco, and vaping devices, such as e-cigarettes. If you need help quitting, ask your health care provider. Do not use drugs. If you are sexually active, practice safe sex. Use a condom or  other form of protection to prevent STIs. Take aspirin  only as told by your health care provider. Make sure that you understand how much to take and what form to take. Work with your health care provider to find out whether it is safe and beneficial for you to take aspirin  daily. Find healthy ways to manage stress, such as: Meditation, yoga, or listening to music. Journaling. Talking to a trusted person. Spending time with friends and family. Minimize exposure to UV radiation to reduce your risk of skin cancer. Safety Always wear your seat belt while driving or riding in a vehicle. Do not drive: If you have been drinking alcohol. Do not ride with someone who has been drinking. When you are tired or distracted. While texting. If you have been using any mind-altering substances or drugs. Wear a helmet and other protective equipment during sports activities. If you have firearms in your house, make sure you follow all gun safety procedures. What's next? Go to your health care provider once a year for an annual wellness visit. Ask your health care provider how often you should have your eyes and teeth checked. Stay up to date on all vaccines. This information is not intended to replace advice given to you by your health care provider. Make sure you discuss any questions you have with your health care provider. Document Revised: 11/19/2020 Document Reviewed: 11/19/2020 Elsevier Patient  Education  2024 [employer].

## 2024-06-21 NOTE — Assessment & Plan Note (Signed)
 Discussed conservative measures including strength training, compression, ice, and over-the-counter meds as needed.

## 2024-06-22 ENCOUNTER — Ambulatory Visit: Payer: Self-pay | Admitting: Family Medicine

## 2024-06-22 NOTE — Progress Notes (Signed)
"   testosterone  is low.  As we discussed at his office visit he may benefit from testosterone  replacement.  Would like for him to let us  know if he would like to pursue this further.  His cholesterol levels at goal and his PSA is normal as well.  Do not need to make any other changes to his treatment plan at this time.  We can recheck everything in a year or so. "

## 2024-06-27 ENCOUNTER — Encounter: Payer: Self-pay | Admitting: Gastroenterology

## 2024-07-09 ENCOUNTER — Ambulatory Visit: Admitting: Family Medicine

## 2024-07-16 ENCOUNTER — Ambulatory Visit: Admitting: Family Medicine

## 2024-07-25 ENCOUNTER — Ambulatory Visit: Admitting: Gastroenterology

## 2025-06-24 ENCOUNTER — Encounter: Admitting: Family Medicine
